# Patient Record
Sex: Female | Born: 1960 | Race: White | Hispanic: No | Marital: Married | Smoking: Never smoker
Health system: Southern US, Community
[De-identification: ages and names within clinical notes are randomized; demographics above are authoritative.]

## PROBLEM LIST (undated history)

## (undated) DIAGNOSIS — I10 Essential (primary) hypertension: Secondary | ICD-10-CM

## (undated) DIAGNOSIS — F419 Anxiety disorder, unspecified: Secondary | ICD-10-CM

## (undated) HISTORY — DX: Anxiety disorder, unspecified: F41.9

## (undated) HISTORY — DX: Essential (primary) hypertension: I10

---

## 1997-09-29 ENCOUNTER — Other Ambulatory Visit: Admission: RE | Admit: 1997-09-29 | Discharge: 1997-09-29 | Payer: Self-pay | Admitting: Obstetrics and Gynecology

## 1998-08-31 ENCOUNTER — Inpatient Hospital Stay (HOSPITAL_COMMUNITY): Admission: AD | Admit: 1998-08-31 | Discharge: 1998-09-03 | Payer: Self-pay | Admitting: Obstetrics and Gynecology

## 1998-10-12 ENCOUNTER — Other Ambulatory Visit: Admission: RE | Admit: 1998-10-12 | Discharge: 1998-10-12 | Payer: Self-pay | Admitting: Obstetrics and Gynecology

## 2000-01-23 ENCOUNTER — Other Ambulatory Visit: Admission: RE | Admit: 2000-01-23 | Discharge: 2000-01-23 | Payer: Self-pay | Admitting: Obstetrics and Gynecology

## 2001-06-17 ENCOUNTER — Other Ambulatory Visit: Admission: RE | Admit: 2001-06-17 | Discharge: 2001-06-17 | Payer: Self-pay | Admitting: Obstetrics and Gynecology

## 2002-09-18 ENCOUNTER — Encounter: Payer: Self-pay | Admitting: Obstetrics and Gynecology

## 2002-09-18 ENCOUNTER — Encounter: Admission: RE | Admit: 2002-09-18 | Discharge: 2002-09-18 | Payer: Self-pay | Admitting: Obstetrics and Gynecology

## 2002-09-23 ENCOUNTER — Other Ambulatory Visit: Admission: RE | Admit: 2002-09-23 | Discharge: 2002-09-23 | Payer: Self-pay | Admitting: Obstetrics and Gynecology

## 2003-10-01 ENCOUNTER — Other Ambulatory Visit: Admission: RE | Admit: 2003-10-01 | Discharge: 2003-10-01 | Payer: Self-pay | Admitting: Obstetrics and Gynecology

## 2003-10-22 ENCOUNTER — Encounter: Admission: RE | Admit: 2003-10-22 | Discharge: 2003-10-22 | Payer: Self-pay | Admitting: Obstetrics and Gynecology

## 2004-11-24 ENCOUNTER — Other Ambulatory Visit: Admission: RE | Admit: 2004-11-24 | Discharge: 2004-11-24 | Payer: Self-pay | Admitting: Obstetrics and Gynecology

## 2004-12-29 ENCOUNTER — Encounter: Admission: RE | Admit: 2004-12-29 | Discharge: 2004-12-29 | Payer: Self-pay | Admitting: Obstetrics and Gynecology

## 2005-11-30 ENCOUNTER — Other Ambulatory Visit: Admission: RE | Admit: 2005-11-30 | Discharge: 2005-11-30 | Payer: Self-pay | Admitting: Obstetrics and Gynecology

## 2006-01-02 ENCOUNTER — Encounter: Admission: RE | Admit: 2006-01-02 | Discharge: 2006-01-02 | Payer: Self-pay | Admitting: Obstetrics and Gynecology

## 2007-01-08 ENCOUNTER — Encounter: Admission: RE | Admit: 2007-01-08 | Discharge: 2007-01-08 | Payer: Self-pay | Admitting: Obstetrics and Gynecology

## 2007-06-20 ENCOUNTER — Other Ambulatory Visit: Admission: RE | Admit: 2007-06-20 | Discharge: 2007-06-20 | Payer: Self-pay | Admitting: Obstetrics and Gynecology

## 2008-01-21 ENCOUNTER — Encounter: Admission: RE | Admit: 2008-01-21 | Discharge: 2008-01-21 | Payer: Self-pay | Admitting: Obstetrics and Gynecology

## 2009-03-04 ENCOUNTER — Encounter: Admission: RE | Admit: 2009-03-04 | Discharge: 2009-03-04 | Payer: Self-pay | Admitting: Obstetrics and Gynecology

## 2009-09-30 LAB — HM PAP SMEAR

## 2010-03-01 ENCOUNTER — Other Ambulatory Visit: Payer: Self-pay | Admitting: Obstetrics and Gynecology

## 2010-03-01 DIAGNOSIS — Z1239 Encounter for other screening for malignant neoplasm of breast: Secondary | ICD-10-CM

## 2010-03-08 ENCOUNTER — Ambulatory Visit
Admission: RE | Admit: 2010-03-08 | Discharge: 2010-03-08 | Disposition: A | Discharge status: Home / Self Care | Payer: PRIVATE HEALTH INSURANCE | Source: Ambulatory Visit | Attending: Obstetrics and Gynecology | Admitting: Obstetrics and Gynecology

## 2010-03-08 DIAGNOSIS — Z1239 Encounter for other screening for malignant neoplasm of breast: Secondary | ICD-10-CM

## 2010-03-10 ENCOUNTER — Ambulatory Visit: Payer: Self-pay

## 2010-03-11 ENCOUNTER — Encounter: Payer: Self-pay | Admitting: Obstetrics and Gynecology

## 2011-03-21 ENCOUNTER — Other Ambulatory Visit: Payer: Self-pay | Admitting: Family Medicine

## 2011-03-21 DIAGNOSIS — Z1231 Encounter for screening mammogram for malignant neoplasm of breast: Secondary | ICD-10-CM

## 2011-04-06 ENCOUNTER — Ambulatory Visit: Payer: PRIVATE HEALTH INSURANCE

## 2011-04-06 LAB — HM COLONOSCOPY

## 2011-04-13 ENCOUNTER — Ambulatory Visit
Admission: RE | Admit: 2011-04-13 | Discharge: 2011-04-13 | Disposition: A | Discharge status: Home / Self Care | Payer: PRIVATE HEALTH INSURANCE | Source: Ambulatory Visit | Attending: Family Medicine | Admitting: Family Medicine

## 2011-04-13 DIAGNOSIS — Z1231 Encounter for screening mammogram for malignant neoplasm of breast: Secondary | ICD-10-CM

## 2012-03-12 ENCOUNTER — Other Ambulatory Visit: Payer: Self-pay | Admitting: Obstetrics and Gynecology

## 2012-03-12 DIAGNOSIS — Z1231 Encounter for screening mammogram for malignant neoplasm of breast: Secondary | ICD-10-CM

## 2012-05-01 ENCOUNTER — Other Ambulatory Visit: Payer: Self-pay | Admitting: Obstetrics and Gynecology

## 2012-05-01 ENCOUNTER — Ambulatory Visit
Admission: RE | Admit: 2012-05-01 | Discharge: 2012-05-01 | Disposition: A | Discharge status: Home / Self Care | Payer: PRIVATE HEALTH INSURANCE | Source: Ambulatory Visit | Attending: Obstetrics and Gynecology | Admitting: Obstetrics and Gynecology

## 2012-05-01 DIAGNOSIS — Z1231 Encounter for screening mammogram for malignant neoplasm of breast: Secondary | ICD-10-CM

## 2012-05-01 LAB — HM MAMMOGRAPHY

## 2012-05-02 ENCOUNTER — Other Ambulatory Visit: Payer: Self-pay | Admitting: Obstetrics and Gynecology

## 2012-05-02 ENCOUNTER — Ambulatory Visit: Payer: Self-pay | Admitting: Obstetrics and Gynecology

## 2012-05-02 ENCOUNTER — Encounter: Payer: Self-pay | Admitting: Obstetrics and Gynecology

## 2012-05-02 ENCOUNTER — Ambulatory Visit (INDEPENDENT_AMBULATORY_CARE_PROVIDER_SITE_OTHER): Payer: PRIVATE HEALTH INSURANCE | Admitting: Obstetrics and Gynecology

## 2012-05-02 VITALS — BP 110/64 | Ht 67.0 in | Wt 210.0 lb

## 2012-05-02 DIAGNOSIS — Z0000 Encounter for general adult medical examination without abnormal findings: Secondary | ICD-10-CM

## 2012-05-02 DIAGNOSIS — Z800 Family history of malignant neoplasm of digestive organs: Secondary | ICD-10-CM

## 2012-05-02 DIAGNOSIS — Z01419 Encounter for gynecological examination (general) (routine) without abnormal findings: Secondary | ICD-10-CM

## 2012-05-02 LAB — POCT URINALYSIS DIPSTICK
Bilirubin, UA: NEGATIVE
Blood, UA: NEGATIVE
Glucose, UA: NEGATIVE
Ketones, UA: NEGATIVE
Leukocytes, UA: NEGATIVE
Nitrite, UA: NEGATIVE
Protein, UA: NEGATIVE
Urobilinogen, UA: NEGATIVE
pH, UA: 7

## 2012-05-02 NOTE — Progress Notes (Signed)
Patient ID: Michele Greer, female   DOB: 1960/08/06, 52 y.o.   MRN: [id number]  52 y.o. MarriedCaucasian female   Michele Greer here for annual exam.   No menses since August 2013.  Occasional hot flashes.   Patient's last menstrual period was 09/20/2011.          Sexually active: yes  The current method of family planning is post menopausal status and vasectomy. Exercising: walking 40 minutes 3 - 4 times a week. Last mammogram:  Yesterday - [facility]. Results pending. Last pap smear:  2011.  No HR HPV done. History of abnormal pap:  Never. Smoking: No. Alcohol:  Occasional wine. Last colonoscopy:  March, 2013 -Benign polyps, Dr Michele Greer.  Patient's sister just diagosed with colon cancer.  Next study due in 5 years. Last Bone Density:  Several years ago.  Normal. Last tetanus shot:  Uncertain.  Believes she had with Dr. Kevan Greer. Last cholesterol check:  Labs with Dr. Kevan Greer.  Urine:  All negative    Health Maintenance  Topic Date Due  . Influenza Vaccine  10/07/1959  . Pap Smear  04/02/1977  . Tetanus/tdap  04/02/1978  . Colonoscopy  04/02/2009  . Mammogram  04/12/2013    Family History  Problem Relation Age of Onset  . Hypertension Father   . Bone cancer Father     deceased  . Osteoarthritis Mother   . Hypertension Mother   . Thyroid disease Mother   . Cancer - Colon Sister     twin sister    There is no problem list on file for this patient.   Past Medical History  Diagnosis Date  . Anxiety   . Hypertension     History reviewed. No pertinent past surgical history.  Allergies: Review of patient's allergies indicates no known allergies.  Current Outpatient Prescriptions  Medication Sig Dispense Refill  . APAP-Isometheptene-Dichloral (MIDRIN PO) Take 1 tablet by mouth as needed.      Marland Kitchen CALCIUM PO Take 1 capsule by mouth daily.      . citalopram (CELEXA) 20 MG tablet Take 20 mg by mouth daily.      . fish oil-omega-3 fatty acids 1000 MG capsule Take 2 g by mouth  daily.      . Ibuprofen 200 MG CAPS Take 2 capsules by mouth as needed.      Marland Kitchen lisinopril-hydrochlorothiazide (PRINZIDE,ZESTORETIC) 20-12.5 MG per tablet Take 1 tablet by mouth daily.      . Multiple Vitamin (MULTIVITAMIN) tablet Take 1 tablet by mouth daily.      Marland Kitchen POTASSIUM PO Take 1 tablet by mouth daily.       No current facility-administered medications for this visit.    ROS: Pertinent items are noted in HPI.  Exam:    BP 110/64  Ht 5\' 7"  (1.702 m)  Wt 210 lb (95.255 kg)  BMI 32.88 kg/m2  LMP 09/20/2011   Wt Readings from Last 3 Encounters:  05/02/12 210 lb (95.255 kg)     Ht Readings from Last 3 Encounters:  05/02/12 5\' 7"  (1.702 m)    General appearance: alert, cooperative and appears stated age Head: Normocephalic, without obvious abnormality, atraumatic Neck: no adenopathy, supple, symmetrical, trachea midline and thyroid not enlarged, symmetric, no tenderness/mass/nodules Lungs: clear to auscultation bilaterally Breasts: Inspection negative, No nipple retraction or dimpling, No nipple discharge or bleeding, No axillary or supraclavicular adenopathy, Normal to palpation without dominant masses Heart: regular rate and rhythm Abdomen: soft, non-tender; bowel sounds normal; no masses,  no organomegaly Extremities: extremities normal, atraumatic, no cyanosis or edema Skin: Skin color, texture, turgor normal. No rashes or lesions Lymph nodes: Cervical, supraclavicular, and axillary nodes normal. No abnormal inguinal nodes palpated Neurologic: Grossly normal   Pelvic: External genitalia:  no lesions              Urethra:  normal appearing urethra with no masses, tenderness or lesions              Bartholins and Skenes: normal                 Vagina: normal appearing vagina with normal color and discharge, no lesions              Cervix: normal appearance              Pap taken: yes and HR HPV requested.        Bimanual Exam:  Uterus:  uterus is normal size, shape,  consistency and nontender                                      Adnexa: normal adnexa in size, nontender and no masses                                      Rectovaginal: Confirms                                      Anus:  normal sphincter tone, no lesions  A: well woman Perimenoapusal female.     P: mammogram pap smear and HR HVP testing return annually or prn     An After Visit Summary was printed and given to the patient.

## 2012-05-02 NOTE — Patient Instructions (Signed)

## 2012-05-07 LAB — IPS PAP TEST WITH HPV

## 2012-06-23 LAB — IFOBT (OCCULT BLOOD): IFOBT: NEGATIVE

## 2012-06-23 NOTE — Addendum Note (Signed)
Addended by: Elisha Headland on: 06/23/2012 11:31 AM   Modules accepted: Orders

## 2012-06-24 ENCOUNTER — Other Ambulatory Visit: Payer: Self-pay | Admitting: Obstetrics and Gynecology

## 2012-06-24 NOTE — Progress Notes (Signed)
Fecal occult blood test performed on specimen received - results negative.

## 2013-04-03 ENCOUNTER — Other Ambulatory Visit: Payer: Self-pay

## 2013-04-03 DIAGNOSIS — Z1231 Encounter for screening mammogram for malignant neoplasm of breast: Secondary | ICD-10-CM

## 2013-05-05 ENCOUNTER — Ambulatory Visit: Payer: PRIVATE HEALTH INSURANCE

## 2013-05-15 ENCOUNTER — Ambulatory Visit
Admission: RE | Admit: 2013-05-15 | Discharge: 2013-05-15 | Disposition: A | Discharge status: Home / Self Care | Payer: 59 | Source: Ambulatory Visit

## 2013-05-15 DIAGNOSIS — Z1231 Encounter for screening mammogram for malignant neoplasm of breast: Secondary | ICD-10-CM

## 2013-12-07 ENCOUNTER — Encounter: Payer: Self-pay | Admitting: Obstetrics and Gynecology

## 2013-12-25 ENCOUNTER — Ambulatory Visit (INDEPENDENT_AMBULATORY_CARE_PROVIDER_SITE_OTHER): Payer: 59 | Admitting: Nurse Practitioner

## 2013-12-25 ENCOUNTER — Encounter: Payer: Self-pay | Admitting: Nurse Practitioner

## 2013-12-25 VITALS — BP 120/74 | HR 64 | Ht 66.25 in | Wt 211.0 lb

## 2013-12-25 DIAGNOSIS — Z01419 Encounter for gynecological examination (general) (routine) without abnormal findings: Secondary | ICD-10-CM

## 2013-12-25 DIAGNOSIS — Z0000 Encounter for general adult medical examination without abnormal findings: Secondary | ICD-10-CM

## 2013-12-25 DIAGNOSIS — Z1211 Encounter for screening for malignant neoplasm of colon: Secondary | ICD-10-CM

## 2013-12-25 LAB — POCT URINALYSIS DIPSTICK
BILIRUBIN UA: NEGATIVE
Glucose, UA: NEGATIVE
KETONES UA: NEGATIVE
LEUKOCYTES UA: NEGATIVE
Nitrite, UA: NEGATIVE
PH UA: 5
Protein, UA: NEGATIVE
RBC UA: NEGATIVE
Urobilinogen, UA: NEGATIVE

## 2013-12-25 NOTE — Patient Instructions (Signed)

## 2013-12-25 NOTE — Progress Notes (Signed)
Patient ID: Michele Greer, female   DOB: Dec 04, 1960, 53 y.o.   MRN: [id number] 53 y.o. G3P3 Married Caucasian Fe here for annual exam.  Some vaso symptoms that are tolerable.  Sleep is OK.   No other new problems this past year.  Anxiety is better on Celexa - now at 1/2 dose daily.  Patient's last menstrual period was 09/20/2011.          Sexually active: Yes.    The current method of family planning is vasectomy and post menopausal status.    Exercising: Yes.    walking 40 minutes 3-4 times per week Smoker:  no  Health Maintenance: Pap:  05/02/12, negative with neg HR HPV  MMG:  05/15/13, Bi-Rads 1:  Negative  Colonoscopy:  March, 2013 -Benign polyps, Dr Watt Climes. Patient's sister diagnosed with colon cancer. Next study due in 5 years. BMD:   2002 -  normal TDaP:  UTD with work Labs:  Dr. Inda Merlin  Urine:  negative   reports that she has never smoked. She does not have any smokeless tobacco history on file. She reports that she drinks about 0.6 oz of alcohol per week. She reports that she does not use illicit drugs.  Past Medical History  Diagnosis Date  . Anxiety   . Hypertension     History reviewed. No pertinent past surgical history.  Current Outpatient Prescriptions  Medication Sig Dispense Refill  . CALCIUM PO Take 1 capsule by mouth daily.    . citalopram (CELEXA) 20 MG tablet Take 20 mg by mouth daily.    . fish oil-omega-3 fatty acids 1000 MG capsule Take 2 g by mouth daily.    . Ibuprofen 200 MG CAPS Take 2 capsules by mouth as needed.    Marland Kitchen lisinopril-hydrochlorothiazide (PRINZIDE,ZESTORETIC) 20-12.5 MG per tablet Take 1 tablet by mouth daily.    . Multiple Vitamin (MULTIVITAMIN) tablet Take 1 tablet by mouth daily.    Marland Kitchen POTASSIUM PO Take 1 tablet by mouth daily.     No current facility-administered medications for this visit.    Family History  Problem Relation Age of Onset  . Hypertension Father   . Bone cancer Father     deceased  . Osteoarthritis Mother   .  Hypertension Mother   . Thyroid disease Mother   . Cancer - Colon Sister     twin sister    ROS:  Pertinent items are noted in HPI.  Otherwise, a comprehensive ROS was negative.  Exam:   BP 120/74 mmHg  Pulse 64  Ht 5' 6.25" (1.683 m)  Wt 211 lb (95.709 kg)  BMI 33.79 kg/m2  LMP 09/20/2011 Height: 5' 6.25" (168.3 cm)  Ht Readings from Last 3 Encounters:  12/25/13 5' 6.25" (1.683 m)  05/02/12 5\' 7"  (1.702 m)    General appearance: alert, cooperative and appears stated age Head: Normocephalic, without obvious abnormality, atraumatic Neck: no adenopathy, supple, symmetrical, trachea midline and thyroid normal to inspection and palpation Lungs: clear to auscultation bilaterally Breasts: normal appearance, no masses or tenderness Heart: regular rate and rhythm Abdomen: soft, non-tender; no masses,  no organomegaly Extremities: extremities normal, atraumatic, no cyanosis or edema Skin: Skin color, texture, turgor normal. No rashes or lesions Lymph nodes: Cervical, supraclavicular, and axillary nodes normal. No abnormal inguinal nodes palpated Neurologic: Grossly normal   Pelvic: External genitalia:  no lesions              Urethra:  normal appearing urethra with no masses, tenderness or lesions  Bartholin's and Skene's: normal                 Vagina: normal appearing vagina with normal color and discharge, no lesions              Cervix: anteverted              Pap taken: No. Bimanual Exam:  Uterus:  normal size, contour, position, consistency, mobility, non-tender              Adnexa: no mass, fullness, tenderness               Rectovaginal: Confirms               Anus:  normal sphincter tone, no lesions  A:  Well Woman with normal exam  Postmenopausal - no HRT  History of anxiety disorder  [place]: colon cancer twin sister stage III   P:   Reviewed health and wellness pertinent to exam  Pap smear taken today  Mammogram is due 4/16  IFOB is given  Counseled  on breast self exam, mammography screening, adequate intake of calcium and vitamin D, diet and exercise, Kegel's exercises return annually or prn  An After Visit Summary was printed and given to the patient.

## 2013-12-27 NOTE — Progress Notes (Signed)
Encounter reviewed by Dr. Arilyn Brierley Silva.  

## 2014-01-07 LAB — FECAL OCCULT BLOOD, IMMUNOCHEMICAL: IFOBT: NEGATIVE

## 2014-01-07 NOTE — Addendum Note (Signed)
Addended by: Graylon Good on: 01/07/2014 04:14 PM   Modules accepted: Orders

## 2014-04-13 ENCOUNTER — Other Ambulatory Visit: Payer: Self-pay

## 2014-04-13 DIAGNOSIS — Z1231 Encounter for screening mammogram for malignant neoplasm of breast: Secondary | ICD-10-CM

## 2014-05-28 ENCOUNTER — Ambulatory Visit
Admission: RE | Admit: 2014-05-28 | Discharge: 2014-05-28 | Disposition: A | Discharge status: Home / Self Care | Payer: 59 | Source: Ambulatory Visit

## 2014-05-28 ENCOUNTER — Ambulatory Visit: Payer: Self-pay

## 2014-05-28 DIAGNOSIS — Z1231 Encounter for screening mammogram for malignant neoplasm of breast: Secondary | ICD-10-CM

## 2015-01-06 ENCOUNTER — Telehealth: Payer: Self-pay | Admitting: Nurse Practitioner

## 2015-01-06 NOTE — Telephone Encounter (Signed)
Left message regarding upcoming appointment has been canceled and needs to be rescheduled. Spoke with patient, she will call later to rs nr

## 2015-01-07 ENCOUNTER — Ambulatory Visit: Payer: Self-pay | Admitting: Nurse Practitioner

## 2015-02-11 ENCOUNTER — Ambulatory Visit (INDEPENDENT_AMBULATORY_CARE_PROVIDER_SITE_OTHER): Payer: 59 | Admitting: Nurse Practitioner

## 2015-02-11 ENCOUNTER — Encounter: Payer: Self-pay | Admitting: Nurse Practitioner

## 2015-02-11 VITALS — BP 120/82 | HR 68 | Ht 66.0 in | Wt 216.0 lb

## 2015-02-11 DIAGNOSIS — Z0000 Encounter for general adult medical examination without abnormal findings: Secondary | ICD-10-CM

## 2015-02-11 DIAGNOSIS — Z01419 Encounter for gynecological examination (general) (routine) without abnormal findings: Secondary | ICD-10-CM

## 2015-02-11 DIAGNOSIS — Z1211 Encounter for screening for malignant neoplasm of colon: Secondary | ICD-10-CM

## 2015-02-11 LAB — POCT URINALYSIS DIPSTICK
Bilirubin, UA: NEGATIVE
Blood, UA: NEGATIVE
Glucose, UA: NEGATIVE
Ketones, UA: NEGATIVE
Leukocytes, UA: NEGATIVE
Nitrite, UA: NEGATIVE
Protein, UA: NEGATIVE
Urobilinogen, UA: NEGATIVE
pH, UA: 6

## 2015-02-11 NOTE — Patient Instructions (Addendum)

## 2015-02-11 NOTE — Progress Notes (Signed)
Patient ID: Michele Greer, female   DOB: 17-Feb-1960, 55 y.o.   MRN: [id number]  56 y.o. G38P0003 Married  Caucasian Fe here for annual exam.  Some vaso symptoms that are tolerable. No sleep issues. Feels well.  Mother now age 91 and just returned from trip to [place] with the oldest brother.  Her twin sister with the colon cancer is in remission and no evidence of recurrence.  Patient's last menstrual period was 09/20/2011 (exact date).          Sexually active: Yes.    The current method of family planning is vasectomy.    Exercising: Yes.    walking Smoker:  no  Health Maintenance: Pap: 05/02/12, Negative with neg HR HPV  MMG:05/28/14, Bi-Rads 1: Negative  Colonoscopy: March, 2013 -Benign polyps, Dr Watt Climes. Patient's sister diagnosed with colon cancer. Next study due in 5 years. BMD: 2002 - normal TDaP: UTD with work Shingles: Not indicated due to age Pneumonia: Not indicated due to age 26 C and HIV: to discuss Labs: Dr. Inda Merlin  Urine: Negative    reports that she has never smoked. She does not have any smokeless tobacco history on file. She reports that she drinks about 0.6 oz of alcohol per week. She reports that she does not use illicit drugs.  Past Medical History  Diagnosis Date  . Anxiety   . Hypertension     History reviewed. No pertinent past surgical history.  Current Outpatient Prescriptions  Medication Sig Dispense Refill  . CALCIUM PO Take 1 capsule by mouth daily.    . citalopram (CELEXA) 20 MG tablet Take 20 mg by mouth daily.    . Ibuprofen 200 MG CAPS Take 2 capsules by mouth as needed.    Marland Kitchen lisinopril-hydrochlorothiazide (PRINZIDE,ZESTORETIC) 20-12.5 MG per tablet Take 1 tablet by mouth daily.    . Multiple Vitamin (MULTIVITAMIN) tablet Take 1 tablet by mouth daily.     No current facility-administered medications for this visit.    Family History  Problem Relation Age of Onset  . Hypertension Father   . Bone cancer Father     deceased  .  Osteoarthritis Mother   . Hypertension Mother   . Thyroid disease Mother   . Cancer - Colon Sister     twin sister    ROS:  Pertinent items are noted in HPI.  Otherwise, a comprehensive ROS was negative.  Exam:   BP 120/82 mmHg  Pulse 68  Ht 5\' 6"  (1.676 m)  Wt 216 lb (97.977 kg)  BMI 34.88 kg/m2  LMP 09/20/2011 (Exact Date) Height: 5\' 6"  (167.6 cm) Ht Readings from Last 3 Encounters:  02/11/15 5\' 6"  (1.676 m)  12/25/13 5' 6.25" (1.683 m)  05/02/12 5\' 7"  (1.702 m)    General appearance: alert, cooperative and appears stated age Head: Normocephalic, without obvious abnormality, atraumatic Neck: no adenopathy, supple, symmetrical, trachea midline and thyroid normal to inspection and palpation Lungs: clear to auscultation bilaterally Breasts: normal appearance, no masses or tenderness Heart: regular rate and rhythm Abdomen: soft, non-tender; no masses,  no organomegaly Extremities: extremities normal, atraumatic, no cyanosis or edema Skin: Skin color, texture, turgor normal. No rashes or lesions Lymph nodes: Cervical, supraclavicular, and axillary nodes normal. No abnormal inguinal nodes palpated Neurologic: Grossly normal   Pelvic: External genitalia:  no lesions              Urethra:  normal appearing urethra with no masses, tenderness or lesions  Bartholin's and Skene's: normal                 Vagina: normal appearing vagina with normal color and discharge, no lesions              Cervix: anteverted              Pap taken: No. Bimanual Exam:  Uterus:  normal size, contour, position, consistency, mobility, non-tender              Adnexa: no mass, fullness, tenderness               Rectovaginal: Confirms               Anus:  normal sphincter tone, no lesions  Chaperone present: no  A:  Well Woman with normal exam  Postmenopausal - no HRT History of anxiety disorder [facility]: colon cancer twin sister stage III - has info on [place]  Syndrome   P:   Reviewed health and wellness pertinent to exam  Pap smear as above  Mammogram is due 05/2015  Will follow with labs and IFOB  Counseled on breast self exam, mammography screening, adequate intake of calcium and vitamin D, diet and exercise return annually or prn  An After Visit Summary was printed and given to the patient.

## 2015-02-12 LAB — HIV ANTIBODY (ROUTINE TESTING W REFLEX): HIV 1&2 Ab, 4th Generation: NONREACTIVE

## 2015-02-12 LAB — HEPATITIS C ANTIBODY: HCV AB: NEGATIVE

## 2015-02-12 NOTE — Progress Notes (Signed)
Encounter reviewed by Dr. Brook Amundson C. Silva.  

## 2015-02-23 ENCOUNTER — Telehealth: Payer: Self-pay | Admitting: Nurse Practitioner

## 2015-02-23 NOTE — Telephone Encounter (Signed)
Update to [place], [place].  Will close encounter.  Routing to provider for final review. Patient agreeable to disposition. Will close encounter.

## 2015-02-23 NOTE — Telephone Encounter (Signed)
Patient's last TDAP was done in September of 2008. She is going for an appointment with Dr. Darcus Austin 06/01/15 and will have her TDAP done then. Patient wanted Ms. Patty to know.

## 2015-03-07 LAB — FECAL OCCULT BLOOD, IMMUNOCHEMICAL: IMMUNOLOGICAL FECAL OCCULT BLOOD TEST: NEGATIVE

## 2015-03-07 NOTE — Addendum Note (Signed)
Addended by: Elroy Channel on: 03/07/2015 04:05 PM   Modules accepted: Orders

## 2015-05-30 ENCOUNTER — Other Ambulatory Visit: Payer: Self-pay

## 2015-05-30 DIAGNOSIS — Z1231 Encounter for screening mammogram for malignant neoplasm of breast: Secondary | ICD-10-CM

## 2015-06-15 ENCOUNTER — Ambulatory Visit: Payer: 59

## 2015-06-17 ENCOUNTER — Ambulatory Visit: Payer: 59

## 2015-06-29 ENCOUNTER — Ambulatory Visit
Admission: RE | Admit: 2015-06-29 | Discharge: 2015-06-29 | Disposition: A | Discharge status: Home / Self Care | Payer: 59 | Source: Ambulatory Visit

## 2015-06-29 DIAGNOSIS — Z1231 Encounter for screening mammogram for malignant neoplasm of breast: Secondary | ICD-10-CM

## 2016-06-06 DIAGNOSIS — R7301 Impaired fasting glucose: Secondary | ICD-10-CM | POA: Diagnosis not present

## 2016-06-06 DIAGNOSIS — I10 Essential (primary) hypertension: Secondary | ICD-10-CM | POA: Diagnosis not present

## 2016-06-07 ENCOUNTER — Other Ambulatory Visit: Payer: Self-pay | Admitting: Nurse Practitioner

## 2016-06-07 DIAGNOSIS — Z1231 Encounter for screening mammogram for malignant neoplasm of breast: Secondary | ICD-10-CM

## 2016-06-15 DIAGNOSIS — D126 Benign neoplasm of colon, unspecified: Secondary | ICD-10-CM | POA: Diagnosis not present

## 2016-06-15 DIAGNOSIS — Z86010 Personal history of colonic polyps: Secondary | ICD-10-CM | POA: Diagnosis not present

## 2016-06-15 DIAGNOSIS — K5730 Diverticulosis of large intestine without perforation or abscess without bleeding: Secondary | ICD-10-CM | POA: Diagnosis not present

## 2016-07-06 ENCOUNTER — Ambulatory Visit: Payer: 59

## 2016-07-31 ENCOUNTER — Ambulatory Visit
Admission: RE | Admit: 2016-07-31 | Discharge: 2016-07-31 | Disposition: A | Discharge status: Home / Self Care | Payer: 59 | Source: Ambulatory Visit | Attending: Nurse Practitioner | Admitting: Nurse Practitioner

## 2016-07-31 DIAGNOSIS — Z1231 Encounter for screening mammogram for malignant neoplasm of breast: Secondary | ICD-10-CM | POA: Diagnosis not present

## 2016-08-22 DIAGNOSIS — L918 Other hypertrophic disorders of the skin: Secondary | ICD-10-CM | POA: Diagnosis not present

## 2016-11-07 DIAGNOSIS — Z23 Encounter for immunization: Secondary | ICD-10-CM | POA: Diagnosis not present

## 2016-12-10 DIAGNOSIS — I10 Essential (primary) hypertension: Secondary | ICD-10-CM | POA: Diagnosis not present

## 2017-01-07 DIAGNOSIS — H16223 Keratoconjunctivitis sicca, not specified as Sjogren's, bilateral: Secondary | ICD-10-CM | POA: Diagnosis not present

## 2017-06-10 DIAGNOSIS — E669 Obesity, unspecified: Secondary | ICD-10-CM | POA: Diagnosis not present

## 2017-06-10 DIAGNOSIS — I10 Essential (primary) hypertension: Secondary | ICD-10-CM | POA: Diagnosis not present

## 2017-07-15 ENCOUNTER — Other Ambulatory Visit: Payer: Self-pay | Admitting: Family Medicine

## 2017-07-15 DIAGNOSIS — Z1231 Encounter for screening mammogram for malignant neoplasm of breast: Secondary | ICD-10-CM

## 2017-08-16 ENCOUNTER — Ambulatory Visit
Admission: RE | Admit: 2017-08-16 | Discharge: 2017-08-16 | Disposition: A | Discharge status: Home / Self Care | Payer: 59 | Source: Ambulatory Visit | Attending: Family Medicine | Admitting: Family Medicine

## 2017-08-16 DIAGNOSIS — Z1231 Encounter for screening mammogram for malignant neoplasm of breast: Secondary | ICD-10-CM

## 2017-12-09 DIAGNOSIS — I10 Essential (primary) hypertension: Secondary | ICD-10-CM | POA: Diagnosis not present

## 2017-12-09 DIAGNOSIS — E669 Obesity, unspecified: Secondary | ICD-10-CM | POA: Diagnosis not present

## 2017-12-09 DIAGNOSIS — Z23 Encounter for immunization: Secondary | ICD-10-CM | POA: Diagnosis not present

## 2018-01-06 ENCOUNTER — Other Ambulatory Visit: Payer: Self-pay

## 2018-01-06 ENCOUNTER — Encounter: Payer: Self-pay | Admitting: Obstetrics and Gynecology

## 2018-01-06 ENCOUNTER — Ambulatory Visit: Payer: 59 | Admitting: Obstetrics and Gynecology

## 2018-01-06 ENCOUNTER — Other Ambulatory Visit (HOSPITAL_COMMUNITY)
Admission: RE | Admit: 2018-01-06 | Discharge: 2018-01-06 | Disposition: A | Discharge status: Home / Self Care | Payer: 59 | Source: Ambulatory Visit | Attending: Obstetrics and Gynecology | Admitting: Obstetrics and Gynecology

## 2018-01-06 VITALS — BP 118/80 | HR 78 | Resp 14 | Ht 65.5 in | Wt 216.0 lb

## 2018-01-06 DIAGNOSIS — Z01419 Encounter for gynecological examination (general) (routine) without abnormal findings: Secondary | ICD-10-CM

## 2018-01-06 DIAGNOSIS — Z1211 Encounter for screening for malignant neoplasm of colon: Secondary | ICD-10-CM | POA: Diagnosis not present

## 2018-01-06 NOTE — Patient Instructions (Signed)

## 2018-01-06 NOTE — Progress Notes (Signed)
57 y.o. G49P0003 Married Caucasian female here for annual exam.    Labs with PCP.   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  Son has Down's Syndrome.  PCP:   Darcus Austin, MD   Patient's last menstrual period was 09/20/2011 (exact date).           Sexually active: Yes.    The current method of family planning is vasectomy.    Exercising: Yes.    walking Smoker:  no  Health Maintenance: Pap:  05-02-12 negative, HR HP negative  History of abnormal Pap:  no MMG:  08-16-17 density B/BIRADS 1 negative  Colonoscopy:  04-2016- Dr. Watt Climes- polyps per patient- f/u 5 years- FH colon cancer in twin sister.  BMD:   2002  Result  Normal  TDaP: Unsure, but thinks UTD with PCP Gardasil:   n/a HIV: 02-11-15 negative  Hep C: 02-11-15 negative  Screening Labs:  Hb today: PCP, Urine today: not collected Flu vaccine:  Done.    reports that she has never smoked. She has never used smokeless tobacco. She reports that she drinks about 1.0 standard drinks of alcohol per week. She reports that she does not use drugs.  Past Medical History:  Diagnosis Date  . Anxiety   . Hypertension     History reviewed. No pertinent surgical history.  Current Outpatient Medications  Medication Sig Dispense Refill  . CALCIUM PO Take 1 capsule by mouth daily.    . citalopram (CELEXA) 10 MG tablet Take 10 mg by mouth daily.  3  . Ibuprofen 200 MG CAPS Take 2 capsules by mouth as needed.    Marland Kitchen lisinopril-hydrochlorothiazide (PRINZIDE,ZESTORETIC) 20-12.5 MG per tablet Take 1 tablet by mouth daily.    . Multiple Vitamin (MULTIVITAMIN) tablet Take 1 tablet by mouth daily.     No current facility-administered medications for this visit.     Family History  Problem Relation Age of Onset  . Hypertension Father   . Bone cancer Father        deceased  . Osteoarthritis Mother   . Hypertension Mother   . Thyroid disease Mother   . Cancer - Colon Sister 64       twin sister  . Heart attack Brother     Review of Systems   Constitutional: Negative.   HENT: Negative.   Eyes: Negative.   Respiratory: Negative.   Cardiovascular: Negative.   Gastrointestinal: Negative.   Endocrine: Negative.   Genitourinary: Negative.   Musculoskeletal: Negative.   Skin: Negative.   Allergic/Immunologic: Negative.   Neurological: Negative.   Hematological: Negative.   Psychiatric/Behavioral: Negative.     Exam:   BP 118/80 (BP Location: Right Arm, Patient Position: Sitting, Cuff Size: Normal)   Pulse 78   Resp 14   Ht 5' 5.5" (1.664 m)   Wt 216 lb (98 kg)   LMP 09/20/2011 (Exact Date)   BMI 35.40 kg/m     General appearance: alert, cooperative and appears stated age Head: Normocephalic, without obvious abnormality, atraumatic Neck: no adenopathy, supple, symmetrical, trachea midline and thyroid normal to inspection and palpation Lungs: clear to auscultation bilaterally Breasts: normal appearance, no masses or tenderness, No nipple retraction or dimpling, No nipple discharge or bleeding, No axillary or supraclavicular adenopathy Heart: regular rate and rhythm Abdomen: soft, non-tender; no masses, no organomegaly Extremities: extremities normal, atraumatic, no cyanosis or edema Skin: Skin color, texture, turgor normal.  Rash across breasts and medial thighs. Patchy plaques.  Lymph nodes: Cervical, supraclavicular, and axillary nodes  normal. No abnormal inguinal nodes palpated Neurologic: Grossly normal  Pelvic: External genitalia:  no lesions              Urethra:  normal appearing urethra with no masses, tenderness or lesions              Bartholins and Skenes: normal                 Vagina: normal appearing vagina with normal color and discharge, no lesions              Cervix: no lesions              Pap taken: Yes.   Bimanual Exam:  Uterus:  normal size, contour, position, consistency, mobility, non-tender              Adnexa: no mass, fullness, tenderness              Rectal exam: Yes.  .  Confirms.               Anus:  normal sphincter tone, no lesions  Chaperone was present for exam.  Assessment:   Well woman visit with normal exam. FH colon cancer in twin sister.  Skin rash.  I suspect tinea corpora.  Plan: Mammogram screening. Recommended self breast awareness. Pap and HR HPV as above. Guidelines for Calcium, Vitamin D, regular exercise program including cardiovascular and weight bearing exercise. She will follow up with dermatology. IFOB. Follow up annually and prn.   After visit summary provided.

## 2018-01-08 LAB — CYTOLOGY - PAP
Diagnosis: NEGATIVE
HPV: NOT DETECTED

## 2018-01-22 ENCOUNTER — Encounter: Payer: Self-pay | Admitting: Cardiovascular Disease

## 2018-01-22 ENCOUNTER — Ambulatory Visit: Payer: 59 | Admitting: Cardiovascular Disease

## 2018-01-22 VITALS — BP 132/84 | HR 57 | Ht 65.5 in | Wt 216.1 lb

## 2018-01-22 DIAGNOSIS — Z8249 Family history of ischemic heart disease and other diseases of the circulatory system: Secondary | ICD-10-CM | POA: Diagnosis not present

## 2018-01-22 DIAGNOSIS — I10 Essential (primary) hypertension: Secondary | ICD-10-CM

## 2018-01-22 NOTE — Patient Instructions (Addendum)
Medication Instructions:  Your physician recommends that you continue on your current medications as directed. Please refer to the Current Medication list given to you today.  If you need a refill on your cardiac medications before your next appointment, please call your pharmacy.    Lab work: None Ordered    Testing/Procedures: Non-Cardiac CT scanning, (CAT scanning) for Calcium score, is a noninvasive, special x-ray that produces cross-sectional images of the body using x-rays and a computer. CT scans help physicians diagnose and treat medical conditions. For some CT exams, a contrast material is used to enhance visibility in the area of the body being studied. CT scans provide greater clarity and reveal more details than regular x-ray exams.    Follow-Up: At [facility], you and your health needs are our priority.  As part of our continuing mission to provide you with exceptional heart care, we have created designated Provider Care Teams.  These Care Teams include your primary Cardiologist (physician) and Advanced Practice Providers (APPs -  Physician Assistants and Nurse Practitioners) who all work together to provide you with the care you need, when you need it. You will need a follow up appointment in:  3 months. You may see Mertie Moores, MD or one of the following Advanced Practice Providers on your designated Care Team: Richardson Dopp, PA-C [place], [place] . Daune Perch, NP

## 2018-01-22 NOTE — Progress Notes (Signed)
Cardiology Office Note:    Date:  01/22/2018   ID:  Michele Greer, DOB 11-May-1960, MRN [id number]  PCP:  Darcus Austin, MD  Cardiologist:  Denys Labree  Electrophysiologist:  None   Referring MD: Darcus Austin, MD   Chief Complaint  Patient presents with  . Hypertension    Problem List 1. Essential HTN 2  Anxiety   History of Present Illness:    Michele Greer is a 57 y.o. female with a hx of HTN   She is here to discuss cardiac risk factors. Father had 2 MI Mother has  AVR Brother had an MI   No CP,   Walks regularly ,   3 days a week .   No DOE  No syncope Some shortness of breath climbing to top of hanging [place]     Past Medical History:  Diagnosis Date  . Anxiety   . Hypertension     History reviewed. No pertinent surgical history.  Current Medications: Current Meds  Medication Sig  . CALCIUM PO Take 1 capsule by mouth daily.  . citalopram (CELEXA) 10 MG tablet Take 10 mg by mouth daily.  . Ibuprofen 200 MG CAPS Take 2 capsules by mouth as needed.  Marland Kitchen lisinopril-hydrochlorothiazide (PRINZIDE,ZESTORETIC) 20-12.5 MG per tablet Take 1 tablet by mouth daily.  . Multiple Vitamin (MULTIVITAMIN) tablet Take 1 tablet by mouth daily.     Allergies:   Patient has no known allergies.   Social History   Socioeconomic History  . Marital status: Married    Spouse name: Not on file  . Number of children: Not on file  . Years of education: Not on file  . Highest education level: Not on file  Occupational History  . Not on file  Social Needs  . Financial resource strain: Not on file  . Food insecurity:    Worry: Not on file    Inability: Not on file  . Transportation needs:    Medical: Not on file    Non-medical: Not on file  Tobacco Use  . Smoking status: Never Smoker  . Smokeless tobacco: Never Used  Substance and Sexual Activity  . Alcohol use: Yes    Alcohol/week: 1.0 standard drinks    Types: 1 Glasses of wine per week  . Drug use: No  . Sexual  activity: Yes    Birth control/protection: Other-see comments    Comment: vasectomy  Lifestyle  . Physical activity:    Days per week: Not on file    Minutes per session: Not on file  . Stress: Not on file  Relationships  . Social connections:    Talks on phone: Not on file    Gets together: Not on file    Attends religious service: Not on file    Active member of club or organization: Not on file    Attends meetings of clubs or organizations: Not on file    Relationship status: Not on file  Other Topics Concern  . Not on file  Social History Narrative  . Not on file     Family History: The patient's family history includes Bone cancer in her father; Cancer - Colon (age of onset: 24) in her sister; Heart attack in her brother; Hypertension in her father and mother; Osteoarthritis in her mother; Thyroid disease in her mother.  ROS:   Please see the history of present illness.     All other systems reviewed and are negative.  EKGs/Labs/Other Studies Reviewed:    The  following studies were reviewed today:  EKG:     Dec. 18, 2019    Sinus brady at 57.  NS ST abn.   Recent Labs: No results found for requested labs within last 8760 hours.  Recent Lipid Panel No results found for: CHOL, TRIG, HDL, CHOLHDL, VLDL, LDLCALC, LDLDIRECT  Physical Exam:    VS:  BP 132/84   Pulse (!) 57   Ht 5' 5.5" (1.664 m)   Wt 216 lb 1.9 oz (98 kg)   LMP 09/20/2011 (Exact Date)   SpO2 95%   BMI 35.42 kg/m     Wt Readings from Last 3 Encounters:  01/22/18 216 lb 1.9 oz (98 kg)  01/06/18 216 lb (98 kg)  02/11/15 216 lb (98 kg)     GEN:  Well nourished, well developed in no acute distress HEENT: Normal NECK: No JVD; No carotid bruits LYMPHATICS: No lymphadenopathy CARDIAC: RRR, no murmurs, rubs, gallops RESPIRATORY:  Clear to auscultation without rales, wheezing or rhonchi  ABDOMEN: Soft, non-tender, non-distended MUSCULOSKELETAL:  No edema; No deformity  SKIN: Warm and  dry NEUROLOGIC:  Alert and oriented x 3 PSYCHIATRIC:  Normal affect   ASSESSMENT:    1. Family history of early CAD   2. Essential hypertension    PLAN:    In order of problems listed above:  1. Family history of cardiac disease: She has a fairly strong family history of cardiac disease.  Her mother, father, and brother have all had coronary artery disease.  She is not having any particular symptoms.  She did have some shortness of breath while climbing hanging [facility].  Last lipid levels look fairly good.  I would like to do a coronary calcium score. .  This will help [place] determine whether or not she needs start on a statin. Advised her to continue with a good diet, exercise, weight loss program.  2.  Essential hypertension: Blood pressure  Is stable.    Medication Adjustments/Labs and Tests Ordered: Current medicines are reviewed at length with the patient today.  Concerns regarding medicines are outlined above.  Orders Placed This Encounter  Procedures  . CT CARDIAC SCORING  . EKG 12-Lead   No orders of the defined types were placed in this encounter.   Patient Instructions  Medication Instructions:  Your physician recommends that you continue on your current medications as directed. Please refer to the Current Medication list given to you today.  If you need a refill on your cardiac medications before your next appointment, please call your pharmacy.    Lab work: None Ordered    Testing/Procedures: Non-Cardiac CT scanning, (CAT scanning) for Calcium score, is a noninvasive, special x-ray that produces cross-sectional images of the body using x-rays and a computer. CT scans help physicians diagnose and treat medical conditions. For some CT exams, a contrast material is used to enhance visibility in the area of the body being studied. CT scans provide greater clarity and reveal more details than regular x-ray exams.    Follow-Up: At [facility], you and  your health needs are our priority.  As part of our continuing mission to provide you with exceptional heart care, we have created designated Provider Care Teams.  These Care Teams include your primary Cardiologist (physician) and Advanced Practice Providers (APPs -  Physician Assistants and Nurse Practitioners) who all work together to provide you with the care you need, when you need it. You will need a follow up appointment in:  3 months.  You may see Mertie Moores, MD or one of the following Advanced Practice Providers on your designated Care Team: Richardson Dopp, PA-C [place], [place] . Daune Perch, NP      Signed, Mertie Moores, MD  01/22/2018 6:07 PM    [place]

## 2018-02-12 ENCOUNTER — Ambulatory Visit (INDEPENDENT_AMBULATORY_CARE_PROVIDER_SITE_OTHER)
Admission: RE | Admit: 2018-02-12 | Discharge: 2018-02-12 | Disposition: A | Discharge status: Home / Self Care | Payer: Self-pay | Source: Ambulatory Visit | Attending: Cardiovascular Disease | Admitting: Cardiovascular Disease

## 2018-02-12 ENCOUNTER — Encounter (INDEPENDENT_AMBULATORY_CARE_PROVIDER_SITE_OTHER): Payer: Self-pay

## 2018-02-12 DIAGNOSIS — Z8249 Family history of ischemic heart disease and other diseases of the circulatory system: Secondary | ICD-10-CM

## 2018-02-13 ENCOUNTER — Telehealth: Payer: Self-pay | Admitting: Nurse Practitioner

## 2018-02-13 DIAGNOSIS — R931 Abnormal findings on diagnostic imaging of heart and coronary circulation: Secondary | ICD-10-CM

## 2018-02-13 DIAGNOSIS — Z8249 Family history of ischemic heart disease and other diseases of the circulatory system: Secondary | ICD-10-CM

## 2018-02-13 MED ORDER — ROSUVASTATIN CALCIUM 5 MG PO TABS: 5.0000 mg | ORAL_TABLET | Freq: Every day | ORAL | 3 refills | Status: DC

## 2018-02-13 NOTE — Telephone Encounter (Signed)
-----   Message from Thayer Headings, MD sent at 02/12/2018  5:07 PM EST ----- Coronary calcium score is very low.  Coronary calcium score is 5 which indicates very minimal plaque accumulation.  I would recommend that we start rosuvastatin 5 mg a day.  Check fasting lipids, liver enzymes and basic metabolic profile in 3 months.  I would also like to check a NMR lipid panel at that time .

## 2018-02-13 NOTE — Telephone Encounter (Signed)
Results and plan of care reviewed with patient who verbalized understanding and agreement. I scheduled patient for lab appointment on 4/3 and follow-up with Dr. Acie Fredrickson was moved to 4/14 from 3/25. I advised patient to call back with questions or concerns prior to appointment and she thanked me for the call.

## 2018-02-24 ENCOUNTER — Telehealth: Payer: Self-pay | Admitting: Cardiovascular Disease

## 2018-02-24 MED ORDER — ROSUVASTATIN CALCIUM 5 MG PO TABS: 5.0000 mg | ORAL_TABLET | ORAL | 3 refills | Status: DC

## 2018-02-24 NOTE — Telephone Encounter (Signed)
Spoke with patient regarding her concerns about Rosuvastatin. I answered questions to her satisfaction. She agrees to start Rosuvastatin 5 mg 3 times per week. She will keep lab appointment for 4/3 and follow-up appointment with Dr. Acie Fredrickson on 4/14. I advised her to call back with questions or concerns prior to those appointments. She thanked me for my help.

## 2018-02-24 NOTE — Telephone Encounter (Signed)
New Message   Pt c/o medication issue:  1. Name of Medication: Rosuvastatin   2. How are you currently taking this medication (dosage and times per day)? 5mg  1x daily   3. Are you having a reaction (difficulty breathing--STAT)? No  4. What is your medication issue? Pt wants to talk about the possible side effects of the medication

## 2018-04-30 ENCOUNTER — Ambulatory Visit: Payer: 59 | Admitting: Cardiovascular Disease

## 2018-05-06 ENCOUNTER — Telehealth: Payer: Self-pay

## 2018-05-06 ENCOUNTER — Telehealth: Payer: Self-pay | Admitting: Cardiovascular Disease

## 2018-05-06 NOTE — Telephone Encounter (Signed)
Michele Greer has given consent to have televisit 05/08/2018 at 9am with Dr. Acie Fredrickson. Michele Greer does not have capability for BP, or HR at home but will have her weight ready for appt.     YOUR CARDIOLOGY TEAM HAS ARRANGED FOR AN E-VISIT FOR YOUR APPOINTMENT - PLEASE REVIEW IMPORTANT INFORMATION BELOW SEVERAL DAYS PRIOR TO YOUR APPOINTMENT  Due to the recent COVID-19 pandemic, we are transitioning in-person office visits to tele-medicine visits in an effort to decrease unnecessary exposure to our patients and staff. Medicare and most insurances are covering these visits without a copay needed. You will need a working email and a smartphone or computer with a camera and microphone. For patients that do not have these items, we can still complete the visit using a telephone but do prefer video when possible. If possible, we also ask that you have a blood pressure cuff and scale at home to measure your blood pressure, heart rate and weight prior to your scheduled appointment. Patients with clinical needs that need an in-person evaluation and testing will still be able to come to the office if absolutely necessary. If you have any questions, feel free to call our office.     DOWNLOADING THE SOFTWARE  Download the [employer] app to enable video and telephone visits with your  [facility] Provider.   Instructions for downloading [employer] WebEx: - Go to https://www.webex.com/downloads.html and follow the instructions, or download the app on your smartphone [facility] [employer] Meetings). - If you have technical difficulties with downloading WebEx, please call WebEx at [phone]. - Once the app is downloaded (can be done on either mobile or desktop computer), go to Settings in the upper left hand corner.  Be sure that camera and audio are enabled.  - You will receive an email message with a link to the meeting with a time to join for your tele-health visit.  - Please download the app and have settings configured prior to the  appointment time.      2-3 DAYS BEFORE YOUR APPOINTMENT  One of our staff will call you to confirm that you have been able to set up your WebEx account. We will remind you check your blood pressure, heart rate and weight prior to your scheduled appointment. If you have an Apple Watch or Kardia, please upload any pertinent ECG strips the day before or morning of your appointment to [place]. Our staff will also make sure you have reviewed the consent and agree to move forward with your scheduled tele-health visit.    THE DAY OF YOUR APPOINTMENT  Approximately 15-20 minutes prior to your scheduled appointment, you will receive an e-mail directly from one of our staff member's [email or web address] e-mail accounts inviting you to join a WebEx meeting.  Please do not reply to that email - simply join the [employer].  Upon joining, a member of the office staff will speak with you initially through the WebEx platform to confirm medications, vital signs for the day and any symptoms you may be experiencing.  Please have this information available prior to the time of visit start.      CONSENT FOR TELE-HEALTH VISIT - PLEASE RVIEW  I hereby voluntarily request, consent and authorize [facility] and its employed or contracted physicians, physician assistants, nurse practitioners or other licensed health care professionals (the Practitioner), to provide me with telemedicine health care services (the "Services") as deemed necessary by the treating Practitioner. I acknowledge and consent to receive the Services by the Practitioner via telemedicine. I  understand that the telemedicine visit will involve communicating with the Practitioner through live audiovisual communication technology and the disclosure of certain medical information by electronic transmission. I acknowledge that I have been given the opportunity to request an in-person assessment or other available alternative prior to the telemedicine visit  and am voluntarily participating in the telemedicine visit.  I understand that I have the right to withhold or withdraw my consent to the use of telemedicine in the course of my care at any time, without affecting my right to future care or treatment, and that the Practitioner or I may terminate the telemedicine visit at any time. I understand that I have the right to inspect all information obtained and/or recorded in the course of the telemedicine visit and may receive copies of available information for a reasonable fee.  I understand that some of the potential risks of receiving the Services via telemedicine include:  Marland Kitchen Delay or interruption in medical evaluation due to technological equipment failure or disruption; . Information transmitted may not be sufficient (e.g. poor resolution of images) to allow for appropriate medical decision making by the Practitioner; and/or  . In rare instances, security protocols could fail, causing a breach of personal health information.  Furthermore, I acknowledge that it is my responsibility to provide information about my medical history, conditions and care that is complete and accurate to the best of my ability. I acknowledge that Practitioner's advice, recommendations, and/or decision may be based on factors not within their control, such as incomplete or inaccurate data provided by me or distortions of diagnostic images or specimens that may result from electronic transmissions. I understand that the practice of medicine is not an exact science and that Practitioner makes no warranties or guarantees regarding treatment outcomes. I acknowledge that I will receive a copy of this consent concurrently upon execution via email to the email address I last provided but may also request a printed copy by calling the office of [place].    I understand that my insurance will be billed for this visit.   I have read or had this consent read to me. . I understand  the contents of this consent, which adequately explains the benefits and risks of the Services being provided via telemedicine.  . I have been provided ample opportunity to ask questions regarding this consent and the Services and have had my questions answered to my satisfaction. . I give my informed consent for the services to be provided through the use of telemedicine in my medical care  By participating in this telemedicine visit I agree to the above.

## 2018-05-06 NOTE — Telephone Encounter (Signed)
Spoke with patient who states that she would prefer to speak to provider over the phone instead of webex.  Confirmed all demographics, e-mail and My Chart activation.

## 2018-05-08 ENCOUNTER — Other Ambulatory Visit: Payer: Self-pay

## 2018-05-08 ENCOUNTER — Telehealth (INDEPENDENT_AMBULATORY_CARE_PROVIDER_SITE_OTHER): Payer: 59 | Admitting: Cardiovascular Disease

## 2018-05-08 DIAGNOSIS — Z8249 Family history of ischemic heart disease and other diseases of the circulatory system: Secondary | ICD-10-CM | POA: Insufficient documentation

## 2018-05-08 DIAGNOSIS — E782 Mixed hyperlipidemia: Secondary | ICD-10-CM

## 2018-05-08 DIAGNOSIS — E785 Hyperlipidemia, unspecified: Secondary | ICD-10-CM | POA: Insufficient documentation

## 2018-05-08 NOTE — Progress Notes (Signed)
Virtual Visit via Telephone Note    Evaluation Performed:  Follow-up visit  This visit type was conducted due to national recommendations for restrictions regarding the COVID-19 Pandemic (e.g. social distancing).  This format is felt to be most appropriate for this patient at this time.  All issues noted in this document were discussed and addressed.  No physical exam was performed (except for noted visual exam findings with Video Visits).  Please refer to the patient's chart (MyChart message for video visits and phone note for telephone visits) for the patient's consent to telehealth for [facility].  Date:  05/08/2018   ID:  Michele Greer, DOB [birth year], MRN [id number]  Patient Location:  Home   Provider location:    [facility], [place]   PCP:  Patient, No Pcp Per  Cardiologist:  Mertie Moores, MD  Electrophysiologist:  None   Chief Complaint:  Follow up HTN,  family hx of CAD   History of Present Illness:    Michele Greer is a 58 y.o. female who presents via audio/video conferencing for a telehealth visit today.    I saw Michele Greer in Dec. 2019. She has a strong family hx of CAD Coronary calcium score is 5 ( 73% of age / sex matched controls)   No CP , no dyspnea. Has been hiking some  Walking more recently with the Covid 19 concern No fever, cough, headaches No syncope or presycope   We have a Lipomed profile ordered which she will have drawn once the virus concern is passes.  Takes crestor 5 mg 3 times a week.     The patient does not have symptoms concerning for COVID-19 infection (fever, chills, cough, or new shortness of breath).    Prior CV studies:   The following studies were reviewed today:    Past Medical History:  Diagnosis Date  . Anxiety   . Hypertension    No past surgical history on file.   Current Meds  Medication Sig  . citalopram (CELEXA) 10 MG tablet Take 10 mg by mouth daily.  . Ibuprofen 200 MG CAPS Take 2 capsules by mouth  as needed.  Marland Kitchen lisinopril-hydrochlorothiazide (PRINZIDE,ZESTORETIC) 20-12.5 MG per tablet Take 1 tablet by mouth daily.  . Multiple Vitamin (MULTIVITAMIN) tablet Take 1 tablet by mouth daily.  . rosuvastatin (CRESTOR) 5 MG tablet Take 1 tablet (5 mg total) by mouth 3 (three) times a week.  . TURMERIC PO Take 1 tablet by mouth daily.     Allergies:   Patient has no known allergies.   Social History   Tobacco Use  . Smoking status: Never Smoker  . Smokeless tobacco: Never Used  Substance Use Topics  . Alcohol use: Yes    Alcohol/week: 1.0 standard drinks    Types: 1 Glasses of wine per week  . Drug use: No     Family Hx: The patient's family history includes Bone cancer in her father; Cancer - Colon (age of onset: 87) in her sister; Heart attack in her brother; Hypertension in her father and mother; Osteoarthritis in her mother; Thyroid disease in her mother.  ROS:   Please see the history of present illness.    All other systems reviewed and are negative.   Labs/Other Tests and Data Reviewed:    Recent Labs: No results found for requested labs within last 8760 hours.   Recent Lipid Panel No results found for: CHOL, TRIG, HDL, CHOLHDL, LDLCALC, LDLDIRECT  Wt Readings from Last 3 Encounters:  05/08/18  213 lb (96.6 kg)  01/22/18 216 lb 1.9 oz (98 kg)  01/06/18 216 lb (98 kg)     Objective:    Vital Signs:  Ht 5\' 8"  (1.727 m)   Wt 213 lb (96.6 kg)   LMP 09/20/2011 (Exact Date)   BMI 32.39 kg/m      ASSESSMENT & PLAN:    1.   CAD:  Coronary calcium score of 5 placing her in the 73rd % for sex / age matched controls.   I would like her LDL around 50-60 if possible.  Will increase her crestor to 5 mg  5 days a week for the next month to seeif she tolerates that .   2.  Hyperlipidemia: We will increase her Crestor as noted above.  She does know she does not tolerate the statin medication.  We will check a lipid med profile in mid to late May.  Her goal LDL is around  50 ideally.  3. Strong family hx of CAD.  We will try to aggressively treat her risk factors.  I have advised her to work on a better diet, exercise, weight loss program.  Continue Crestor.  COVID-19 Education: The signs and symptoms of COVID-19 were discussed with the patient and how to seek care for testing (follow up with PCP or arrange E-visit).  The importance of social distancing was discussed today.  Patient Risk:   After full review of this patient's clinical status, I feel that they are at least moderate risk at this time.  Time:   Today, I have spent 17 minutes with the patient with telehealth technology discussing cholesterol managemnt, wt loss .     Medication Adjustments/Labs and Tests Ordered: Current medicines are reviewed at length with the patient today.  Concerns regarding medicines are outlined above.  Tests Ordered: No orders of the defined types were placed in this encounter.  Medication Changes: No orders of the defined types were placed in this encounter.   Disposition:  Follow up in 6 month(s)  Signed, Mertie Moores, MD  05/08/2018 9:07 AM    [facility]

## 2018-05-08 NOTE — Patient Instructions (Addendum)
Medication Instructions:  Your physician has recommended you make the following change in your medication:  INCREASE Crestor (Rosuvastatin) to 5 mg 5 days per week then to 7 days per week  If you need a refill on your cardiac medications before your next appointment, please call your pharmacy.    Lab work: Your physician recommends that you return for lab work on Tuesday May 19. You may come in anytime after 7:30 am You will need to FAST for this appointment - nothing to eat or drink after midnight the night before except water.   Testing/Procedures: None Ordered   Follow-Up: At [facility], you and your health needs are our priority.  As part of our continuing mission to provide you with exceptional heart care, we have created designated Provider Care Teams.  These Care Teams include your primary Cardiologist (physician) and Advanced Practice Providers (APPs -  Physician Assistants and Nurse Practitioners) who all work together to provide you with the care you need, when you need it. You will need a follow up appointment in:  6 months.  Please call our office 2 months in advance to schedule this appointment.  You may see Mertie Moores, MD or one of the following Advanced Practice Providers on your designated Care Team: Richardson Dopp, PA-C [place], [place] . Daune Perch, NP       [facility] Low Glycemic Diet (Source: [facility], 2006) Low Glycemic Foods (20-49) (Decrease risk of developing heart disease) Breakfast Cereals: All-Bran All-Bran Fruit 'n Oats Fiber One Oatmeal (not instant) Oat bran Fruits and fruit juices: (Limit to 1-2 servings per day) Apples Apricots (fresh & dried) Blackberries Blueberries Cherries Cranberries Peaches Pears Plums Prunes Grapefruit Raspberries Strawberries Tangerine Apple juice Grapefruit juice Tomato juice Beans and legumes (fresh-cooked): Black-eyed peas Butter beans Chick peas Lentils  Green beans [place]ima  beans Kidney beans Navy beans Pinto beans Snow peas Non-starchy vegetables: Asparagus, avocado, broccoli, cabbage, cauliflower, celery, cucumber, greens, lettuce, mushrooms, peppers, tomatoes, okra, onions, spinach, summer squash Grains: Barley Bulgur Rye Wild rice Nuts and oils : Almonds Peanuts Sunflower seeds Hazelnuts Pecans Walnuts Oils that are liquid at room temperature Dairy, fish, meat, soy, and eggs: Milk, skim Lowfat cheese Yogurt, lowfat, fruit sugar sweetened Lean red meat Fish  Skinless chicken & [place] Shellfish Egg whites (up to 3 daily) Soy products  Egg yolks (up to 7 or _____ per week) Moderate Glycemic Foods (50-69) Breakfast Cereals: Bran Buds Bran Chex Just Right Mini-Wheats  Special K Swiss muesli Fruits: Banana (under-ripe) Dates Figs Grapes Kiwi Mango Oranges Raisins Fruit Juices: Cranberry juice Orange juice Beans and legumes: [place]-type baked beans Canned pinto, kidney, or navy beans Green peas Vegetables: Beets Carrots  Sweet potato Yam Corn on the cob Breads: Pita (pocket) bread Oat bran bread Pumpernickel bread Rye bread Wheat bread, high fiber  Grains: Cornmeal Rice, brown Rice, white Couscous Pasta: Macaroni Pizza, cheese Ravioli, meat filled Spaghetti, white  Nuts: Cashews Macadamia Snacks: Chocolate Ice cream, lowfat Muffin Popcorn High Glycemic Foods (70-100)  Breakfast Cereals: Cheerios Corn Chex Corn Flakes Cream of Wheat Grape Nuts Grape Nut Flakes Grits Nutri-Grain Puffed Rice Puffed Wheat Rice Chex Rice Krispies Shredded Wheat Team Total Fruits: Pineapple Watermelon Banana (over-ripe) Beverages: Sodas, sweet tea, pineapple juice Vegetables: Potato, baked, boiled, fried, mashed [place] fries Canned or frozen corn Parsnips Winter squash Breads: Most breads (white and whole grain) Bagels Bread sticks Bread stuffing Kaiser roll Dinner rolls Grains: Rice, instant Tapioca, with milk Candy and  most cookies  Snacks: Donuts Corn chips Jelly beans Pretzels Pastries               Mediterranean Diet A Mediterranean diet refers to food and lifestyle choices that are based on the traditions of countries located on the [employer]. This way of eating has been shown to help prevent certain conditions and improve outcomes for people who have chronic diseases, like kidney disease and heart disease. What are tips for following this plan? Lifestyle  Cook and eat meals together with your family, when possible.  Drink enough fluid to keep your urine clear or pale yellow.  Be physically active every day. This includes: ? Aerobic exercise like running or swimming. ? Leisure activities like gardening, walking, or housework.  Get 7-8 hours of sleep each night.  If recommended by your health care provider, drink red wine in moderation. This means 1 glass a day for nonpregnant women and 2 glasses a day for men. A glass of wine equals 5 oz (150 mL). Reading food labels   Check the serving size of packaged foods. For foods such as rice and pasta, the serving size refers to the amount of cooked product, not dry.  Check the total fat in packaged foods. Avoid foods that have saturated fat or trans fats.  Check the ingredients list for added sugars, such as corn syrup. Shopping  At the grocery store, buy most of your food from the areas near the walls of the store. This includes: ? Fresh fruits and vegetables (produce). ? Grains, beans, nuts, and seeds. Some of these may be available in unpackaged forms or large amounts (in bulk). ? Fresh seafood. ? Poultry and eggs. ? Low-fat dairy products.  Buy whole ingredients instead of prepackaged foods.  Buy fresh fruits and vegetables in-season from local farmers markets.  Buy frozen fruits and vegetables in resealable bags.  If you do not have access to quality fresh seafood, buy precooked frozen shrimp or canned fish, such as tuna, salmon, or  sardines.  Buy small amounts of raw or cooked vegetables, salads, or olives from the deli or salad bar at your store.  Stock your pantry so you always have certain foods on hand, such as olive oil, canned tuna, canned tomatoes, rice, pasta, and beans. Cooking  Cook foods with extra-virgin olive oil instead of using butter or other vegetable oils.  Have meat as a side dish, and have vegetables or grains as your main dish. This means having meat in small portions or adding small amounts of meat to foods like pasta or stew.  Use beans or vegetables instead of meat in common dishes like chili or lasagna.  Experiment with different cooking methods. Try roasting or broiling vegetables instead of steaming or sauteing them.  Add frozen vegetables to soups, stews, pasta, or rice.  Add nuts or seeds for added healthy fat at each meal. You can add these to yogurt, salads, or vegetable dishes.  Marinate fish or vegetables using olive oil, lemon juice, garlic, and fresh herbs. Meal planning   Plan to eat 1 vegetarian meal one day each week. Try to work up to 2 vegetarian meals, if possible.  Eat seafood 2 or more times a week.  Have healthy snacks readily available, such as: ? Vegetable sticks with hummus. ? [place] yogurt. ? Fruit and nut trail mix.  Eat balanced meals throughout the week. This includes: ? Fruit: 2-3 servings a day ? Vegetables: 4-5 servings a day ? Low-fat  dairy: 2 servings a day ? Fish, poultry, or lean meat: 1 serving a day ? Beans and legumes: 2 or more servings a week ? Nuts and seeds: 1-2 servings a day ? Whole grains: 6-8 servings a day ? Extra-virgin olive oil: 3-4 servings a day  Limit red meat and sweets to only a few servings a month What are my food choices?  Mediterranean diet ? Recommended ? Grains: Whole-grain pasta. Brown rice. Bulgar wheat. Polenta. Couscous. Whole-wheat bread. [place]. ? Vegetables: Artichokes. Beets. Broccoli. Cabbage.  Carrots. Eggplant. Green beans. Chard. Kale. Spinach. Onions. Leeks. Peas. Squash. Tomatoes. Peppers. Radishes. ? Fruits: Apples. Apricots. Avocado. Berries. Bananas. Cherries. Dates. Figs. Grapes. Lemons. Melon. Oranges. Peaches. Plums. Pomegranate. ? Meats and other protein foods: Beans. Almonds. Sunflower seeds. Pine nuts. Peanuts. [place]. Salmon. Scallops. Shrimp. [place]. Tilapia. Clams. Oysters. Eggs. ? Dairy: Low-fat milk. Cheese. Greek yogurt. ? Beverages: Water. Red wine. Herbal tea. ? Fats and oils: Extra virgin olive oil. Avocado oil. Grape seed oil. ? Sweets and desserts: [place] yogurt with honey. Baked apples. Poached pears. Trail mix. ? Seasoning and other foods: Basil. Cilantro. Coriander. Cumin. Mint. Parsley. Sage. Rosemary. Tarragon. Garlic. Oregano. Thyme. Pepper. Balsalmic vinegar. Tahini. Hummus. Tomato sauce. Olives. Mushrooms. ? Limit these ? Grains: Prepackaged pasta or rice dishes. Prepackaged cereal with added sugar. ? Vegetables: Deep fried potatoes (french fries). ? Fruits: Fruit canned in syrup. ? Meats and other protein foods: Beef. Pork. Lamb. Poultry with skin. Hot dogs. Berniece Salines. ? Dairy: Ice cream. Sour cream. Whole milk. ? Beverages: Juice. Sugar-sweetened soft drinks. Beer. Liquor and spirits. ? Fats and oils: Butter. Canola oil. Vegetable oil. Beef fat (tallow). Lard. ? Sweets and desserts: Cookies. Cakes. Pies. Candy. ? Seasoning and other foods: Mayonnaise. Premade sauces and marinades. ? The items listed may not be a complete list. Talk with your dietitian about what dietary choices are right for you. Summary  The Mediterranean diet includes both food and lifestyle choices.  Eat a variety of fresh fruits and vegetables, beans, nuts, seeds, and whole grains.  Limit the amount of red meat and sweets that you eat.  Talk with your health care provider about whether it is safe for you to drink red wine in moderation. This means 1 glass a day for nonpregnant women  and 2 glasses a day for men. A glass of wine equals 5 oz (150 mL). This information is not intended to replace advice given to you by your health care provider. Make sure you discuss any questions you have with your health care provider. Document Released: 09/15/2015 Document Revised: 10/18/2015 Document Reviewed: 09/15/2015 Elsevier Interactive Patient Education  2019 [employer].

## 2018-05-09 ENCOUNTER — Other Ambulatory Visit: Payer: 59

## 2018-05-16 ENCOUNTER — Other Ambulatory Visit: Payer: 59

## 2018-05-20 ENCOUNTER — Ambulatory Visit: Payer: 59 | Admitting: Cardiovascular Disease

## 2018-06-20 ENCOUNTER — Other Ambulatory Visit: Payer: Self-pay

## 2018-06-20 ENCOUNTER — Other Ambulatory Visit: Payer: 59 | Admitting: *Deleted

## 2018-06-20 DIAGNOSIS — R931 Abnormal findings on diagnostic imaging of heart and coronary circulation: Secondary | ICD-10-CM

## 2018-06-20 DIAGNOSIS — Z8249 Family history of ischemic heart disease and other diseases of the circulatory system: Secondary | ICD-10-CM

## 2018-06-21 LAB — BASIC METABOLIC PANEL
BUN/Creatinine Ratio: 24 — ABNORMAL HIGH (ref 9–23)
BUN: 20 mg/dL (ref 6–24)
CO2: 28 mmol/L (ref 20–29)
Calcium: 9.9 mg/dL (ref 8.7–10.2)
Chloride: 100 mmol/L (ref 96–106)
Creatinine, Ser: 0.83 mg/dL (ref 0.57–1.00)
GFR calc Af Amer: 89 mL/min/{1.73_m2} (ref >59)
GFR calc non Af Amer: 77 mL/min/{1.73_m2} (ref >59)
Glucose: 100 mg/dL — ABNORMAL HIGH (ref 65–99)
Potassium: 4 mmol/L (ref 3.5–5.2)
Sodium: 141 mmol/L (ref 134–144)

## 2018-06-21 LAB — NMR LIPOPROF + GRAPH
Cholesterol, Total: 145 mg/dL (ref 100–199)
HDL Particle Number: 41.9 umol/L (ref >=30.5)
HDL-C: 54 mg/dL (ref >39)
LDL Particle Number: 1044 nmol/L — ABNORMAL HIGH (ref <1000)
LDL Size: 20 nm — ABNORMAL LOW (ref >20.5)
LDL-C: 74 mg/dL (ref 0–99)
LP-IR Score: 56 — ABNORMAL HIGH (ref <=45)
Small LDL Particle Number: 701 nmol/L — ABNORMAL HIGH (ref <=527)
Triglycerides: 86 mg/dL (ref 0–149)

## 2018-06-21 LAB — HEPATIC FUNCTION PANEL
ALT: 29 IU/L (ref 0–32)
AST: 27 IU/L (ref 0–40)
Albumin: 4.5 g/dL (ref 3.8–4.9)
Alkaline Phosphatase: 59 IU/L (ref 39–117)
Bilirubin Total: 0.5 mg/dL (ref 0.0–1.2)
Bilirubin, Direct: 0.14 mg/dL (ref 0.00–0.40)
Total Protein: 6.2 g/dL (ref 6.0–8.5)

## 2018-06-24 ENCOUNTER — Other Ambulatory Visit: Payer: 59

## 2018-06-25 ENCOUNTER — Telehealth: Payer: Self-pay | Admitting: Cardiovascular Disease

## 2018-06-25 DIAGNOSIS — I10 Essential (primary) hypertension: Secondary | ICD-10-CM

## 2018-06-25 DIAGNOSIS — R931 Abnormal findings on diagnostic imaging of heart and coronary circulation: Secondary | ICD-10-CM

## 2018-06-25 DIAGNOSIS — E782 Mixed hyperlipidemia: Secondary | ICD-10-CM

## 2018-06-25 DIAGNOSIS — Z79899 Other long term (current) drug therapy: Secondary | ICD-10-CM

## 2018-06-25 MED ORDER — ROSUVASTATIN CALCIUM 5 MG PO TABS: 5.0000 mg | ORAL_TABLET | ORAL | 3 refills | Status: DC

## 2018-06-25 MED ORDER — EZETIMIBE 10 MG PO TABS: 10.0000 mg | ORAL_TABLET | Freq: Every day | ORAL | 3 refills | Status: DC

## 2018-06-25 NOTE — Telephone Encounter (Signed)
Pt verbalized understanding to add Zetia to her med regimen... will return 10/01/18 for repeat fasting labwork. Will call if she has any problems.

## 2018-06-25 NOTE — Telephone Encounter (Signed)
Pt given Dr. Elmarie Shiley recommendations re: her Lipids and to increase her Rosuvastatin to daily.. pt had increased the 5mg  from 3 days to 5 days: M,W,F,Sat since her 05/08/18 visit and she feels she could be at her MAX dosing.. even though she has noticed the increased muscle aches, it is tolerable but thinks it may not be if worsens but willing to try a 5th day if Dr. Acie Fredrickson recommends with this added information.   I advised her to keep doing whet she doing and I will forward to Dr. Dahlia Client for review and advice.

## 2018-06-25 NOTE — Telephone Encounter (Signed)
New Message ° ° °Pt is returning call for lab results  ° ° °Please call back  °

## 2018-06-25 NOTE — Telephone Encounter (Signed)
I dont think adding just 1 additional Rosuvastatin a week will get [place] to goal. Lets add Zetia 10 mg a day  Check labs - lipids. Liver, bmp in 3 months

## 2018-07-30 ENCOUNTER — Other Ambulatory Visit: Payer: Self-pay | Admitting: Family Medicine

## 2018-07-30 DIAGNOSIS — Z1231 Encounter for screening mammogram for malignant neoplasm of breast: Secondary | ICD-10-CM

## 2018-09-12 ENCOUNTER — Ambulatory Visit: Payer: 59

## 2018-09-19 ENCOUNTER — Other Ambulatory Visit: Payer: Self-pay

## 2018-09-19 ENCOUNTER — Ambulatory Visit
Admission: RE | Admit: 2018-09-19 | Discharge: 2018-09-19 | Disposition: A | Discharge status: Home / Self Care | Payer: 59 | Source: Ambulatory Visit | Attending: Family Medicine | Admitting: Family Medicine

## 2018-09-19 DIAGNOSIS — Z1231 Encounter for screening mammogram for malignant neoplasm of breast: Secondary | ICD-10-CM

## 2018-10-01 ENCOUNTER — Other Ambulatory Visit: Payer: 59 | Admitting: *Deleted

## 2018-10-01 ENCOUNTER — Other Ambulatory Visit: Payer: Self-pay

## 2018-10-01 DIAGNOSIS — R931 Abnormal findings on diagnostic imaging of heart and coronary circulation: Secondary | ICD-10-CM

## 2018-10-01 DIAGNOSIS — E782 Mixed hyperlipidemia: Secondary | ICD-10-CM

## 2018-10-01 DIAGNOSIS — I10 Essential (primary) hypertension: Secondary | ICD-10-CM

## 2018-10-01 DIAGNOSIS — Z79899 Other long term (current) drug therapy: Secondary | ICD-10-CM

## 2018-10-01 LAB — LIPID PANEL
Chol/HDL Ratio: 2.6 ratio (ref 0.0–4.4)
Cholesterol, Total: 140 mg/dL (ref 100–199)
HDL: 53 mg/dL (ref >39)
LDL Calculated: 77 mg/dL (ref 0–99)
Triglycerides: 50 mg/dL (ref 0–149)
VLDL Cholesterol Cal: 10 mg/dL (ref 5–40)

## 2018-10-01 LAB — BASIC METABOLIC PANEL
BUN/Creatinine Ratio: 22 (ref 9–23)
BUN: 17 mg/dL (ref 6–24)
CO2: 23 mmol/L (ref 20–29)
Calcium: 9.5 mg/dL (ref 8.7–10.2)
Chloride: 103 mmol/L (ref 96–106)
Creatinine, Ser: 0.77 mg/dL (ref 0.57–1.00)
GFR calc Af Amer: 98 mL/min/{1.73_m2} (ref >59)
GFR calc non Af Amer: 85 mL/min/{1.73_m2} (ref >59)
Glucose: 117 mg/dL — ABNORMAL HIGH (ref 65–99)
Potassium: 3.9 mmol/L (ref 3.5–5.2)
Sodium: 141 mmol/L (ref 134–144)

## 2018-10-01 LAB — HEPATIC FUNCTION PANEL
ALT: 22 IU/L (ref 0–32)
AST: 18 IU/L (ref 0–40)
Albumin: 4.5 g/dL (ref 3.8–4.9)
Alkaline Phosphatase: 58 IU/L (ref 39–117)
Bilirubin Total: 0.4 mg/dL (ref 0.0–1.2)
Bilirubin, Direct: 0.12 mg/dL (ref 0.00–0.40)
Total Protein: 6.4 g/dL (ref 6.0–8.5)

## 2018-10-31 ENCOUNTER — Telehealth: Payer: Self-pay | Admitting: Cardiovascular Disease

## 2018-10-31 DIAGNOSIS — Z8249 Family history of ischemic heart disease and other diseases of the circulatory system: Secondary | ICD-10-CM

## 2018-10-31 DIAGNOSIS — E782 Mixed hyperlipidemia: Secondary | ICD-10-CM

## 2018-10-31 DIAGNOSIS — R931 Abnormal findings on diagnostic imaging of heart and coronary circulation: Secondary | ICD-10-CM

## 2018-10-31 NOTE — Telephone Encounter (Signed)
New message   Patient would like to discuss a medication change in reference to being a different medication that patient was put on by PCP.

## 2018-10-31 NOTE — Telephone Encounter (Signed)
Spoke with patient who states she saw her PCP, Dr. Lindell Noe, recently and was telling her about her leg pain since starting rosuvastatin. Dr. Lindell Noe advised her to switch to pravastatin to see if symptoms resolve. Patient called to ask Dr. Elmarie Shiley opinion about this. I advised that Dr. Acie Fredrickson advises patients that pravastatin is not as strong as rosuvastatin and his regimen is usually to try to tolerate rosuvastatin a few days per week in the place of daily pravastatin. I asked patient if she is taking ezetimibe and she states she did take it for a while and then stopped because she thought it was making the leg pain worse. She has been taking rosuvastatin 5 mg 4 days per week. I advised that we could schedule an appointment with Lipid clinic to discuss potential PCSK-9 inhibitor therapy or we could try a different medication regimen. She states she would like to try the pravastatin to see how she feels. She agrees to add the ezetimibe 10 mg back into her daily regimen when she starts pravastatin 20 mg. I scheduled a lab appointment and follow-up with Dr. Acie Fredrickson for 3 months. I advised her to call back prior to her appointment with questions or concerns. She verbalized understanding and agreement and thanked me for the call.

## 2018-11-02 NOTE — Telephone Encounter (Signed)
Agree with the plan ( and the various options ) outlined by Christen Bame, RN.

## 2019-01-09 ENCOUNTER — Other Ambulatory Visit: Payer: 59 | Admitting: *Deleted

## 2019-01-09 ENCOUNTER — Other Ambulatory Visit: Payer: Self-pay

## 2019-01-09 DIAGNOSIS — E782 Mixed hyperlipidemia: Secondary | ICD-10-CM

## 2019-01-09 DIAGNOSIS — R931 Abnormal findings on diagnostic imaging of heart and coronary circulation: Secondary | ICD-10-CM

## 2019-01-09 DIAGNOSIS — Z8249 Family history of ischemic heart disease and other diseases of the circulatory system: Secondary | ICD-10-CM

## 2019-01-13 LAB — BASIC METABOLIC PANEL
BUN/Creatinine Ratio: 20 (ref 9–23)
BUN: 16 mg/dL (ref 6–24)
CO2: 25 mmol/L (ref 20–29)
Calcium: 9.4 mg/dL (ref 8.7–10.2)
Chloride: 99 mmol/L (ref 96–106)
Creatinine, Ser: 0.82 mg/dL (ref 0.57–1.00)
GFR calc Af Amer: 91 mL/min/{1.73_m2} (ref >59)
GFR calc non Af Amer: 79 mL/min/{1.73_m2} (ref >59)
Glucose: 102 mg/dL — ABNORMAL HIGH (ref 65–99)
Potassium: 4.1 mmol/L (ref 3.5–5.2)
Sodium: 138 mmol/L (ref 134–144)

## 2019-01-13 LAB — HEPATIC FUNCTION PANEL
ALT: 27 IU/L (ref 0–32)
AST: 21 IU/L (ref 0–40)
Albumin: 4.5 g/dL (ref 3.8–4.9)
Alkaline Phosphatase: 61 IU/L (ref 39–117)
Bilirubin Total: 0.5 mg/dL (ref 0.0–1.2)
Bilirubin, Direct: 0.17 mg/dL (ref 0.00–0.40)
Total Protein: 6.5 g/dL (ref 6.0–8.5)

## 2019-01-13 LAB — NMR LIPOPROF + GRAPH
Cholesterol, Total: 152 mg/dL (ref 100–199)
HDL Particle Number: 42.8 umol/L (ref >=30.5)
HDL-C: 56 mg/dL (ref >39)
LDL Particle Number: 1236 nmol/L — ABNORMAL HIGH (ref <1000)
LDL Size: 20.5 nm — ABNORMAL LOW (ref >20.5)
LDL-C (NIH Calc): 80 mg/dL (ref 0–99)
LP-IR Score: 64 — ABNORMAL HIGH (ref <=45)
Small LDL Particle Number: 612 nmol/L — ABNORMAL HIGH (ref <=527)
Triglycerides: 87 mg/dL (ref 0–149)

## 2019-01-14 ENCOUNTER — Telehealth (INDEPENDENT_AMBULATORY_CARE_PROVIDER_SITE_OTHER): Payer: 59 | Admitting: Cardiovascular Disease

## 2019-01-14 ENCOUNTER — Encounter: Payer: Self-pay | Admitting: Cardiovascular Disease

## 2019-01-14 ENCOUNTER — Other Ambulatory Visit: Payer: Self-pay

## 2019-01-14 VITALS — Ht 68.0 in | Wt 211.0 lb

## 2019-01-14 DIAGNOSIS — R931 Abnormal findings on diagnostic imaging of heart and coronary circulation: Secondary | ICD-10-CM | POA: Diagnosis not present

## 2019-01-14 DIAGNOSIS — E782 Mixed hyperlipidemia: Secondary | ICD-10-CM

## 2019-01-14 DIAGNOSIS — I10 Essential (primary) hypertension: Secondary | ICD-10-CM

## 2019-01-14 DIAGNOSIS — Z8249 Family history of ischemic heart disease and other diseases of the circulatory system: Secondary | ICD-10-CM

## 2019-01-14 NOTE — Patient Instructions (Signed)
Medication Instructions:  Your physician recommends that you continue on your current medications as directed. Please refer to the Current Medication list given to you today.  *If you need a refill on your cardiac medications before your next appointment, please call your pharmacy*  Lab Work: None Ordered   Testing/Procedures: None Ordered   Follow-Up: You have been referred to Lipid Clinic for management of your high cholesterol   At [facility], you and your health needs are our priority.  As part of our continuing mission to provide you with exceptional heart care, we have created designated Provider Care Teams.  These Care Teams include your primary Cardiologist (physician) and Advanced Practice Providers (APPs -  Physician Assistants and Nurse Practitioners) who all work together to provide you with the care you need, when you need it.  Your next appointment:   6 month(s)  The format for your next appointment:   Either In Person or Virtual  Provider:   Richardson Dopp, PA-C, Robbie Lis, PA-C or Daune Perch, NP

## 2019-01-14 NOTE — Progress Notes (Signed)
Virtual Visit via Video Note   This visit type was conducted due to national recommendations for restrictions regarding the COVID-19 Pandemic (e.g. social distancing) in an effort to limit this patient's exposure and mitigate transmission in our community.  Due to her co-morbid illnesses, this patient is at least at moderate risk for complications without adequate follow up.  This format is felt to be most appropriate for this patient at this time.  All issues noted in this document were discussed and addressed.  A limited physical exam was performed with this format.  Please refer to the patient's chart for her consent to telehealth for [facility].   Date:  01/15/2019   ID:  Michele Greer, DOB [birth year], MRN [id number]  Patient Location: Home Provider Location: Office  PCP:  Glenis Smoker, MD  Cardiologist:  Mertie Moores, MD  Electrophysiologist:  None   Evaluation Performed:  Follow-Up Visit  Chief Complaint:  Hyperlipidemia    Dec. 9, 2020   Michele Greer is a 58 y.o. female with history of hyperlipidemia and a strong family history of coronary artery disease.  Her coronary calcium score is 5 which is 73 percentile of age/sex matched controls.  She is seen today via telemedicine visit for follow-up of her hyperlipidemia.  She has been on Pravachol but her recent NMR lipid panel revealed an elevated LDL particle number and we have suggested that she would probably do better on rosuvastatin.  Doing well .   Her last NMR lipid profille shwed an elevate LDL particle number  Saw a new primary MD recently  Has been having some muscle aches on rosuvastatin .   Has been taking the rosuvastatin 4 days a week and zetia 10 mg 4 days a week .    We discussed PCSK-9 inhibitor   The patient does not have symptoms concerning for COVID-19 infection (fever, chills, cough, or new shortness of breath).    Past Medical History:  Diagnosis Date  . Anxiety   . Hypertension     No past surgical history on file.   Current Meds  Medication Sig  . citalopram (CELEXA) 10 MG tablet Take 10 mg by mouth daily.  Marland Kitchen ezetimibe (ZETIA) 10 MG tablet Take 1 tablet (10 mg total) by mouth daily.  . Ibuprofen 200 MG CAPS Take 2 capsules by mouth as needed (pain).   Marland Kitchen lisinopril-hydrochlorothiazide (ZESTORETIC) 20-25 MG tablet Take 1 tablet by mouth daily.  . Multiple Vitamin (MULTIVITAMIN) tablet Take 1 tablet by mouth daily.  . rosuvastatin (CRESTOR) 5 MG tablet Take 5 mg by mouth. 4 days a weeks  . TURMERIC PO Take 1 tablet by mouth daily.     Allergies:   Patient has no known allergies.   Social History   Tobacco Use  . Smoking status: Never Smoker  . Smokeless tobacco: Never Used  Substance Use Topics  . Alcohol use: Yes    Alcohol/week: 1.0 standard drinks    Types: 1 Glasses of wine per week  . Drug use: No     Family Hx: The patient's family history includes Bone cancer in her father; Cancer - Colon (age of onset: 54) in her sister; Heart attack in her brother; Hypertension in her father and mother; Osteoarthritis in her mother; Thyroid disease in her mother.  ROS:   Please see the history of present illness.     All other systems reviewed and are negative.   Prior CV studies:   The following studies were reviewed  today:    Labs/Other Tests and Data Reviewed:    EKG:  No ECG reviewed.  Recent Labs: 01/09/2019: ALT 27; BUN 16; Creatinine, Ser 0.82; Potassium 4.1; Sodium 138   Recent Lipid Panel Lab Results  Component Value Date/Time   CHOL 140 10/01/2018 08:06 AM   TRIG 50 10/01/2018 08:06 AM   HDL 53 10/01/2018 08:06 AM   CHOLHDL 2.6 10/01/2018 08:06 AM   LDLCALC 77 10/01/2018 08:06 AM    Wt Readings from Last 3 Encounters:  01/14/19 211 lb (95.7 kg)  05/08/18 213 lb (96.6 kg)  01/22/18 216 lb 1.9 oz (98 kg)     Objective:    Vital Signs:  Ht 5\' 8"  (1.727 m)   Wt 211 lb (95.7 kg)   LMP 09/20/2011 (Exact Date)   BMI 32.08 kg/m     VITAL SIGNS:  reviewed GEN:  no acute distress EYES:  sclerae anicteric, EOMI - Extraocular Movements Intact RESPIRATORY:  normal respiratory effort, symmetric expansion CARDIOVASCULAR:  no peripheral edema SKIN:  no rash, lesions or ulcers. MUSCULOSKELETAL:  no obvious deformities. NEURO:  alert and oriented x 3, no obvious focal deficit PSYCH:  normal affect  ASSESSMENT & PLAN:    1. Hyperlipidemia: Michele Greer continues to have mildly elevated lipids.  She does not tolerate rosuvastatin and I do not think that any other statin will come close to treating her adequately.  We will refer her to the lipid clinic for further evaluation.  I suspect that she will be a good candidate for PCSK9 inhibitor given her very strong family history of premature coronary artery disease.  COVID-19 Education: The signs and symptoms of COVID-19 were discussed with the patient and how to seek care for testing (follow up with PCP or arrange E-visit).  The importance of social distancing was discussed today.  Time:   Today, I have spent  18  minutes with the patient with telehealth technology discussing the above problems.     Medication Adjustments/Labs and Tests Ordered: Current medicines are reviewed at length with the patient today.  Concerns regarding medicines are outlined above.   Tests Ordered: Orders Placed This Encounter  Procedures  . AMB Referral to [facility]    Medication Changes: No orders of the defined types were placed in this encounter.   Follow Up:  Virtual Visit  in 1 year(s)  This note is not being shared with the patient for the following reason: To respect privacy (The patient or proxy has requested that the information not be shared).  Note sharing was not required on Dec. 9 which is the day this note was written    Signed, Mertie Moores, MD  01/15/2019 5:37 PM    [employer]

## 2019-01-15 ENCOUNTER — Encounter: Payer: Self-pay | Admitting: Cardiovascular Disease

## 2019-01-15 DIAGNOSIS — R931 Abnormal findings on diagnostic imaging of heart and coronary circulation: Secondary | ICD-10-CM | POA: Insufficient documentation

## 2019-01-28 ENCOUNTER — Ambulatory Visit: Payer: 59

## 2019-02-19 ENCOUNTER — Encounter: Payer: Self-pay | Admitting: General Practice

## 2019-03-13 ENCOUNTER — Ambulatory Visit: Payer: 59 | Admitting: Internal Medicine

## 2019-04-05 ENCOUNTER — Ambulatory Visit: Payer: 59 | Attending: Internal Medicine

## 2019-04-05 DIAGNOSIS — Z23 Encounter for immunization: Secondary | ICD-10-CM | POA: Insufficient documentation

## 2019-04-05 NOTE — Progress Notes (Signed)
   Covid-19 Vaccination Clinic  Name:  Chauncy Dondlinger    MRN: [id number] DOB: 1960/08/27  04/05/2019  Ms. Puopolo was observed post Covid-19 immunization for 15 minutes without incidence. She was provided with Vaccine Information Sheet and instruction to access the V-Safe system.   Ms. Mulrooney was instructed to call 911 with any severe reactions post vaccine: Marland Kitchen Difficulty breathing  . Swelling of your face and throat  . A fast heartbeat  . A bad rash all over your body  . Dizziness and weakness    Immunizations Administered    Name Date Dose VIS Date Route   Pfizer COVID-19 Vaccine 04/05/2019  8:55 AM 0.3 mL 01/16/2019 Intramuscular   Manufacturer: [place]   Lot: [id number]   [place]: [id number]

## 2019-04-07 ENCOUNTER — Other Ambulatory Visit: Payer: Self-pay

## 2019-04-08 ENCOUNTER — Ambulatory Visit (INDEPENDENT_AMBULATORY_CARE_PROVIDER_SITE_OTHER): Payer: 59 | Admitting: Obstetrics and Gynecology

## 2019-04-08 ENCOUNTER — Encounter: Payer: Self-pay | Admitting: Obstetrics and Gynecology

## 2019-04-08 VITALS — BP 120/76 | HR 70 | Temp 97.0°F | Resp 16 | Ht 65.5 in | Wt 213.2 lb

## 2019-04-08 DIAGNOSIS — Z01419 Encounter for gynecological examination (general) (routine) without abnormal findings: Secondary | ICD-10-CM | POA: Diagnosis not present

## 2019-04-08 NOTE — Patient Instructions (Signed)

## 2019-04-08 NOTE — Progress Notes (Signed)
59 y.o. G46P0003 Married Caucasian female here for annual exam.    No vaginal bleeding or spotting.  Some vaginal dryness.  Not bothersome.   Just saw her PCP and had her labs done.   PCP:  Sela Hilding, MD   Patient's last menstrual period was 09/20/2011 (exact date).           Sexually active: Yes.    The current method of family planning is vasectomy.    Exercising: Yes.    walks 3-4 days weekly Smoker:  no  Health Maintenance: Pap: 01-06-18 Neg:Neg HR HPV, 05-02-12 Neg:Neg HR HPV, 09-30-09 Neg History of abnormal Pap:  no MMG: 09-19-18 3D/Neg/density B/BiRads1 Colonoscopy: 04/2016 polyps--[facility];next due 04/2021(FH colon cancer in twin sister) BMD: 2002 Result :normal TDaP: PCP Gardasil:   no HIV: 02-11-15 NR Hep C:02-11-15 Neg Screening Labs:  PCP Covid:  First vaccine completed.    reports that she has never smoked. She has never used smokeless tobacco. She reports current alcohol use of about 1.0 standard drinks of alcohol per week. She reports that she does not use drugs.  Past Medical History:  Diagnosis Date  . Anxiety   . Hypertension     History reviewed. No pertinent surgical history.  Current Outpatient Medications  Medication Sig Dispense Refill  . citalopram (CELEXA) 10 MG tablet Take 10 mg by mouth daily.  3  . Ibuprofen 200 MG CAPS Take 2 capsules by mouth as needed (pain).     Marland Kitchen lisinopril-hydrochlorothiazide (ZESTORETIC) 20-25 MG tablet Take 1 tablet by mouth daily.    . Multiple Vitamin (MULTIVITAMIN) tablet Take 1 tablet by mouth daily.    . rosuvastatin (CRESTOR) 5 MG tablet Take 5 mg by mouth. 4 days a weeks    . TURMERIC PO Take 1 tablet by mouth daily.     No current facility-administered medications for this visit.    Family History  Problem Relation Age of Onset  . Hypertension Father   . Bone cancer Father        deceased  . Osteoarthritis Mother   . Hypertension Mother   . Thyroid disease Mother   . Cancer - Colon Sister 85   twin sister  . Heart attack Brother     Review of Systems  All other systems reviewed and are negative.   Exam:   BP 120/76 (Cuff Size: Large)   Pulse 70   Temp (!) 97 F (36.1 C) (Temporal)   Resp 16   Ht 5' 5.5" (1.664 m)   Wt 213 lb 3.2 oz (96.7 kg)   LMP 09/20/2011 (Exact Date)   BMI 34.94 kg/m     General appearance: alert, cooperative and appears stated age Head: normocephalic, without obvious abnormality, atraumatic Neck: no adenopathy, supple, symmetrical, trachea midline and thyroid normal to inspection and palpation Lungs: clear to auscultation bilaterally Breasts: normal appearance, no masses or tenderness, No nipple retraction or dimpling, No nipple discharge or bleeding, No axillary adenopathy Heart: regular rate and rhythm Abdomen: soft, non-tender; no masses, no organomegaly Extremities: extremities normal, atraumatic, no cyanosis or edema Skin: skin color, texture, turgor normal. No rashes or lesions Lymph nodes: cervical, supraclavicular, and axillary nodes normal. Neurologic: grossly normal  Pelvic: External genitalia:  no lesions              No abnormal inguinal nodes palpated.              Urethra:  normal appearing urethra with no masses, tenderness or lesions  Bartholins and Skenes: normal                 Vagina: normal appearing vagina with normal color and discharge, no lesions              Cervix: no lesions              Pap taken: No. Bimanual Exam:  Uterus:  normal size, contour, position, consistency, mobility, non-tender              Adnexa: no mass, fullness, tenderness              Rectal exam: Yes.  .  Confirms.              Anus:  normal sphincter tone, no lesions  Chaperone was present for exam.  Assessment:   Well woman visit with normal exam. FH colon cancer in twin sister.   Plan: Mammogram screening discussed. Self breast awareness reviewed. Pap and HR HPV as above. Guidelines for Calcium, Vitamin D, regular  exercise program including cardiovascular and weight bearing exercise. We discussed Shingrix vaccine after she has completed her Covid vaccine.  Follow up annually and prn.   After visit summary provided.

## 2019-04-29 ENCOUNTER — Ambulatory Visit: Payer: 59

## 2019-05-12 ENCOUNTER — Other Ambulatory Visit: Payer: Self-pay | Admitting: Cardiovascular Disease

## 2019-05-12 NOTE — Telephone Encounter (Signed)
Since pt does not tolerate rosuvastatin very well, do you want her to continue this medication? Please advise. Thanks

## 2019-05-13 ENCOUNTER — Ambulatory Visit: Payer: 59 | Attending: Internal Medicine

## 2019-05-13 DIAGNOSIS — Z23 Encounter for immunization: Secondary | ICD-10-CM

## 2019-05-13 NOTE — Progress Notes (Signed)
   Covid-19 Vaccination Clinic  Name:  Michele Greer    MRN: [id number] DOB: [birth year]  05/13/2019  [name] was observed post Covid-19 immunization for 15 minutes without incident. She was provided with Vaccine Information Sheet and instruction to access the V-Safe system.   [name] was instructed to call 911 with any severe reactions post vaccine: Marland Kitchen Difficulty breathing  . Swelling of face and throat  . A fast heartbeat  . A bad rash all over body  . Dizziness and weakness   Immunizations Administered    Name Date Dose VIS Date Route   Pfizer COVID-19 Vaccine 05/13/2019 11:17 AM 0.03 mL 01/16/2019 Intramuscular   Manufacturer: [employer], [employer]   Lot: [id number]   [place]: [id number]

## 2019-05-22 ENCOUNTER — Telehealth: Payer: Self-pay | Admitting: Cardiovascular Disease

## 2019-05-22 NOTE — Telephone Encounter (Signed)
Michele Greer is calling wanting to know if Dr. Acie Fredrickson is wanting her to have fasting labs before or during her appointment scheduled for 07/15/19. Please advise.

## 2019-05-22 NOTE — Telephone Encounter (Signed)
I spoke to the patient and recommended that she fasts the morning of her appointment with Dr Acie Fredrickson, in case he would like to draw labs.  She verbalized understanding.

## 2019-07-15 ENCOUNTER — Ambulatory Visit: Payer: 59 | Admitting: Cardiovascular Disease

## 2019-08-06 ENCOUNTER — Encounter: Payer: Self-pay | Admitting: Cardiovascular Disease

## 2019-08-06 NOTE — Progress Notes (Signed)
Cardiology Office Note:    Date:  08/07/2019   ID:  Michele Greer, DOB 1960/04/11, MRN [id number]  PCP:  Glenis Smoker, MD  Cardiologist:  Devanie Galanti  Electrophysiologist:  None   Referring MD: Glenis Smoker, *   Chief Complaint  Patient presents with  . Hyperlipidemia    Problem List 1. Essential HTN 2  Anxiety   History of Present Illness:    Michele Greer is a 59 y.o. female with a hx of HTN   She is here to discuss cardiac risk factors. Father had 2 MI Mother has  AVR Brother had an MI   No CP,   Walks regularly ,   3 days a week .   No DOE  No syncope Some shortness of breath climbing to top of hanging rock   August 07, 2019: Michele Greer  is seen today for follow-up visit.  She has a history of coronary calcifications and hyperlipidemia. Coronary calcium score of 5. This was 23 rd percentile for age and sex matched control.  Based on her presence of mild coronary artery disease we started her on rosuvastatin 5 mg a day. Last LDL particle number was 1236.  Her LDL is 80.  She is scheduled for repeat lipid levels this visit.  Tried taking zetia with the rosuvastatin but developed some MSK discomfort.  Getting some exercise  Could do more .   Diet has been ok.  Not as good for the past several weeks.   Past Medical History:  Diagnosis Date  . Anxiety   . Hypertension     History reviewed. No pertinent surgical history.  Current Medications: Current Meds  Medication Sig  . citalopram (CELEXA) 10 MG tablet Take 10 mg by mouth daily.  . Ibuprofen 200 MG CAPS Take 2 capsules by mouth as needed (pain).   Marland Kitchen lisinopril-hydrochlorothiazide (ZESTORETIC) 20-25 MG tablet Take 1 tablet by mouth daily.  . Multiple Vitamin (MULTIVITAMIN) tablet Take 1 tablet by mouth daily.  . rosuvastatin (CRESTOR) 5 MG tablet TAKE 1 TABLET BY MOUTH EVERY DAY  . TURMERIC PO Take 1 tablet by mouth daily.     Allergies:   Patient has no known allergies.   Social History    Socioeconomic History  . Marital status: Married    Spouse name: Not on file  . Number of children: Not on file  . Years of education: Not on file  . Highest education level: Not on file  Occupational History  . Not on file  Tobacco Use  . Smoking status: Never Smoker  . Smokeless tobacco: Never Used  Vaping Use  . Vaping Use: Never used  Substance and Sexual Activity  . Alcohol use: Yes    Alcohol/week: 1.0 standard drink    Types: 1 Glasses of wine per week  . Drug use: No  . Sexual activity: Yes    Birth control/protection: Other-see comments    Comment: vasectomy  Other Topics Concern  . Not on file  Social History Narrative  . Not on file   Social Determinants of Health   Financial Resource Strain:   . Difficulty of Paying Living Expenses:   Food Insecurity:   . Worried About Charity fundraiser in the Last Year:   . Arboriculturist in the Last Year:   Transportation Needs:   . Film/video editor (Medical):   Marland Kitchen Lack of Transportation (Non-Medical):   Physical Activity:   . Days of Exercise per Week:   .  Minutes of Exercise per Session:   Stress:   . Feeling of Stress :   Social Connections:   . Frequency of Communication with Friends and Family:   . Frequency of Social Gatherings with Friends and Family:   . Attends Religious Services:   . Active Member of Clubs or Organizations:   . Attends Archivist Meetings:   Marland Kitchen Marital Status:      Family History: The patient's family history includes Bone cancer in her father; Cancer - Colon (age of onset: 55) in her sister; Heart attack in her brother; Hypertension in her father and mother; Osteoarthritis in her mother; Thyroid disease in her mother.  ROS:   Please see the history of present illness.     All other systems reviewed and are negative.  EKGs/Labs/Other Studies Reviewed:    The following studies were reviewed today:    Recent Labs: 01/09/2019: ALT 27; BUN 16; Creatinine, Ser  0.82; Potassium 4.1; Sodium 138  Recent Lipid Panel    Component Value Date/Time   CHOL 140 10/01/2018 0806   TRIG 50 10/01/2018 0806   HDL 53 10/01/2018 0806   CHOLHDL 2.6 10/01/2018 0806   LDLCALC 77 10/01/2018 0806    Physical Exam:    Physical Exam: Blood pressure 132/90, pulse 60, height 5\' 8"  (1.727 m), weight 220 lb (99.8 kg), last menstrual period 09/20/2011, SpO2 97 %.  GEN:  Well nourished, well developed in no acute distress HEENT: Normal NECK: No JVD; No carotid bruits LYMPHATICS: No lymphadenopathy CARDIAC: RRR , no murmurs, rubs, gallops RESPIRATORY:  Clear to auscultation without rales, wheezing or rhonchi  ABDOMEN: Soft, non-tender, non-distended MUSCULOSKELETAL:  No edema; No deformity  SKIN: Warm and dry NEUROLOGIC:  Alert and oriented x 3  EKG:     August 07, 2019:   NSR at 61,  No ST or T abn.  No changes from previous ecg   ASSESSMENT:    1. Elevated coronary artery calcium score   2. Mixed hyperlipidemia   3. Family history of early CAD   42. Medication management    PLAN:    In order of problems listed above:  1.  Hyperlipidemia:   With a  Strong Family history of cardiac disease:   Is tolerating rosuvastatin 5.   She has Zetia which we might need to add Check lipid NMR today , liver ezn, bmp today  2.  Essential hypertension: . Diastolic bp is mildly elevated.   Admits she was off her diet this past week - vacation .    Medication Adjustments/Labs and Tests Ordered: Current medicines are reviewed at length with the patient today.  Concerns regarding medicines are outlined above.  Orders Placed This Encounter  Procedures  . Apolipoprotein B  . Hepatic function panel  . NMR LipoProf + Graph  . Basic Metabolic Panel (BMET)  . EKG 12-Lead   No orders of the defined types were placed in this encounter.   Patient Instructions  Medication Instructions:  Your physician recommends that you continue on your current medications as directed.  Please refer to the Current Medication list given to you today.  *If you need a refill on your cardiac medications before your next appointment, please call your pharmacy*   Lab Work: TODAY - apolipoprotein B, NMR with lipids, liver panel, BMET If you have labs (blood work) drawn today and your tests are completely normal, you will receive your results only by: Marland Kitchen MyChart Message (if you have MyChart) OR .  A paper copy in the mail If you have any lab test that is abnormal or we need to change your treatment, we will call you to review the results.   Testing/Procedures: None Ordered   Follow-Up: At [facility], you and your health needs are our priority.  As part of our continuing mission to provide you with exceptional heart care, we have created designated Provider Care Teams.  These Care Teams include your primary Cardiologist (physician) and Advanced Practice Providers (APPs -  Physician Assistants and Nurse Practitioners) who all work together to provide you with the care you need, when you need it.   Your next appointment:   1 year(s)  The format for your next appointment:   In Person  Provider:   You may see Mertie Moores, MD or one of the following Advanced Practice Providers on your designated Care Team:    Richardson Dopp, PA-C  Robbie Lis, [place]        Signed, Mertie Moores, MD  08/07/2019 9:02 AM    [place]

## 2019-08-07 ENCOUNTER — Ambulatory Visit: Payer: 59 | Admitting: Cardiovascular Disease

## 2019-08-07 ENCOUNTER — Other Ambulatory Visit: Payer: Self-pay

## 2019-08-07 ENCOUNTER — Encounter: Payer: Self-pay | Admitting: Cardiovascular Disease

## 2019-08-07 VITALS — BP 132/90 | HR 60 | Ht 68.0 in | Wt 220.0 lb

## 2019-08-07 DIAGNOSIS — R931 Abnormal findings on diagnostic imaging of heart and coronary circulation: Secondary | ICD-10-CM

## 2019-08-07 DIAGNOSIS — Z79899 Other long term (current) drug therapy: Secondary | ICD-10-CM

## 2019-08-07 DIAGNOSIS — E782 Mixed hyperlipidemia: Secondary | ICD-10-CM | POA: Diagnosis not present

## 2019-08-07 DIAGNOSIS — Z8249 Family history of ischemic heart disease and other diseases of the circulatory system: Secondary | ICD-10-CM | POA: Diagnosis not present

## 2019-08-07 NOTE — Patient Instructions (Addendum)
Medication Instructions:  Your physician recommends that you continue on your current medications as directed. Please refer to the Current Medication list given to you today.  *If you need a refill on your cardiac medications before your next appointment, please call your pharmacy*   Lab Work: TODAY - apolipoprotein B, NMR with lipids, liver panel, BMET If you have labs (blood work) drawn today and your tests are completely normal, you will receive your results only by: Marland Kitchen MyChart Message (if you have MyChart) OR . A paper copy in the mail If you have any lab test that is abnormal or we need to change your treatment, we will call you to review the results.   Testing/Procedures: None Ordered   Follow-Up: At [facility], you and your health needs are our priority.  As part of our continuing mission to provide you with exceptional heart care, we have created designated Provider Care Teams.  These Care Teams include your primary Cardiologist (physician) and Advanced Practice Providers (APPs -  Physician Assistants and Nurse Practitioners) who all work together to provide you with the care you need, when you need it.   Your next appointment:   1 year(s)  The format for your next appointment:   In Person  Provider:   You may see Mertie Moores, MD or one of the following Advanced Practice Providers on your designated Care Team:    Richardson Dopp, PA-C  [place], [place]

## 2019-08-08 LAB — BASIC METABOLIC PANEL
BUN/Creatinine Ratio: 20 (ref 12–28)
BUN: 16 mg/dL (ref 8–27)
CO2: 24 mmol/L (ref 20–29)
Calcium: 9.5 mg/dL (ref 8.7–10.3)
Chloride: 101 mmol/L (ref 96–106)
Creatinine, Ser: 0.8 mg/dL (ref 0.57–1.00)
GFR calc Af Amer: 93 mL/min/{1.73_m2} (ref >59)
GFR calc non Af Amer: 80 mL/min/{1.73_m2} (ref >59)
Glucose: 109 mg/dL — ABNORMAL HIGH (ref 65–99)
Potassium: 4.1 mmol/L (ref 3.5–5.2)
Sodium: 139 mmol/L (ref 134–144)

## 2019-08-08 LAB — HEPATIC FUNCTION PANEL
ALT: 30 IU/L (ref 0–32)
AST: 22 IU/L (ref 0–40)
Albumin: 4.7 g/dL (ref 3.8–4.9)
Alkaline Phosphatase: 61 IU/L (ref 48–121)
Bilirubin Total: 0.5 mg/dL (ref 0.0–1.2)
Bilirubin, Direct: 0.13 mg/dL (ref 0.00–0.40)
Total Protein: 6.4 g/dL (ref 6.0–8.5)

## 2019-08-08 LAB — NMR LIPOPROF + GRAPH
Cholesterol, Total: 174 mg/dL (ref 100–199)
HDL Particle Number: 43.5 umol/L (ref >=30.5)
HDL-C: 62 mg/dL (ref >39)
LDL Particle Number: 1266 nmol/L — ABNORMAL HIGH (ref <1000)
LDL Size: 20.6 nm (ref >20.5)
LDL-C (NIH Calc): 96 mg/dL (ref 0–99)
LP-IR Score: 56 — ABNORMAL HIGH (ref <=45)
Small LDL Particle Number: 492 nmol/L (ref <=527)
Triglycerides: 84 mg/dL (ref 0–149)

## 2019-08-08 LAB — APOLIPOPROTEIN B: Apolipoprotein B: 83 mg/dL (ref <90)

## 2019-08-19 ENCOUNTER — Telehealth: Payer: Self-pay | Admitting: Cardiovascular Disease

## 2019-08-19 DIAGNOSIS — E785 Hyperlipidemia, unspecified: Secondary | ICD-10-CM

## 2019-08-19 MED ORDER — EZETIMIBE 10 MG PO TABS: 10.0000 mg | ORAL_TABLET | Freq: Every day | ORAL | 3 refills | Status: DC

## 2019-08-19 NOTE — Telephone Encounter (Signed)
Patient called back. Informed her of Dr. Elmarie Shiley recommendations, agree with trial of increasing rosuvastatin and restarting zetia 10 mg a day and recheck NMR lipid profile , liver enz in 3 months. Patient will come in on 10/13 for fasting lab work.

## 2019-08-19 NOTE — Telephone Encounter (Signed)
-----   Message from Thayer Headings, MD sent at 08/13/2019 12:58 PM EDT ----- LDL particle number is still elevated.   The other labs are stable .  Still has some insulin resistance.   She can try rosuvastatin 10 mg a day but she has had difficulty tolerating this high of a dose in the past.  I would favor referring her to the lipid clinic for consideration of PCSK9 inhibitor Cont. Diet, exercise, weight loss efforts.

## 2019-11-06 ENCOUNTER — Other Ambulatory Visit: Payer: Self-pay | Admitting: Family Medicine

## 2019-11-06 DIAGNOSIS — Z0000 Encounter for general adult medical examination without abnormal findings: Secondary | ICD-10-CM

## 2019-11-11 ENCOUNTER — Other Ambulatory Visit: Payer: Self-pay

## 2019-11-11 ENCOUNTER — Ambulatory Visit
Admission: RE | Admit: 2019-11-11 | Discharge: 2019-11-11 | Disposition: A | Discharge status: Home / Self Care | Payer: 59 | Source: Ambulatory Visit | Attending: Family Medicine | Admitting: Family Medicine

## 2019-11-11 DIAGNOSIS — Z0000 Encounter for general adult medical examination without abnormal findings: Secondary | ICD-10-CM

## 2019-11-18 ENCOUNTER — Other Ambulatory Visit: Payer: 59 | Admitting: *Deleted

## 2019-11-18 ENCOUNTER — Other Ambulatory Visit: Payer: Self-pay

## 2019-11-18 DIAGNOSIS — E785 Hyperlipidemia, unspecified: Secondary | ICD-10-CM

## 2019-11-20 LAB — HEPATIC FUNCTION PANEL
ALT: 27 IU/L (ref 0–32)
AST: 22 IU/L (ref 0–40)
Albumin: 4.6 g/dL (ref 3.8–4.9)
Alkaline Phosphatase: 59 IU/L (ref 44–121)
Bilirubin Total: 0.5 mg/dL (ref 0.0–1.2)
Bilirubin, Direct: 0.16 mg/dL (ref 0.00–0.40)
Total Protein: 6.4 g/dL (ref 6.0–8.5)

## 2019-11-20 LAB — SPECIMEN STATUS REPORT

## 2019-11-20 LAB — NMR, LIPOPROFILE
Cholesterol, Total: 147 mg/dL (ref 100–199)
HDL Particle Number: 38.4 umol/L (ref >=30.5)
HDL-C: 54 mg/dL (ref >39)
LDL Particle Number: 965 nmol/L (ref <1000)
LDL Size: 20.3 nm — ABNORMAL LOW (ref >20.5)
LDL-C (NIH Calc): 79 mg/dL (ref 0–99)
LP-IR Score: 47 — ABNORMAL HIGH (ref <=45)
Small LDL Particle Number: 472 nmol/L (ref <=527)
Triglycerides: 69 mg/dL (ref 0–149)

## 2020-04-13 ENCOUNTER — Ambulatory Visit: Payer: 59 | Admitting: Obstetrics and Gynecology

## 2020-05-16 ENCOUNTER — Other Ambulatory Visit: Payer: Self-pay | Admitting: Cardiovascular Disease

## 2020-07-11 ENCOUNTER — Ambulatory Visit: Payer: 59 | Admitting: Obstetrics and Gynecology

## 2020-08-05 ENCOUNTER — Ambulatory Visit: Payer: Self-pay | Admitting: Cardiovascular Disease

## 2020-08-27 IMAGING — MG DIGITAL SCREENING BILATERAL MAMMOGRAM WITH TOMO AND CAD
8 series · 8 of 24 positions shown · non-contrast
Comparison: Previous exam(s).

CLINICAL DATA: Screening.

EXAM:
DIGITAL SCREENING BILATERAL MAMMOGRAM WITH TOMO AND CAD

[R MLO synth-2D]
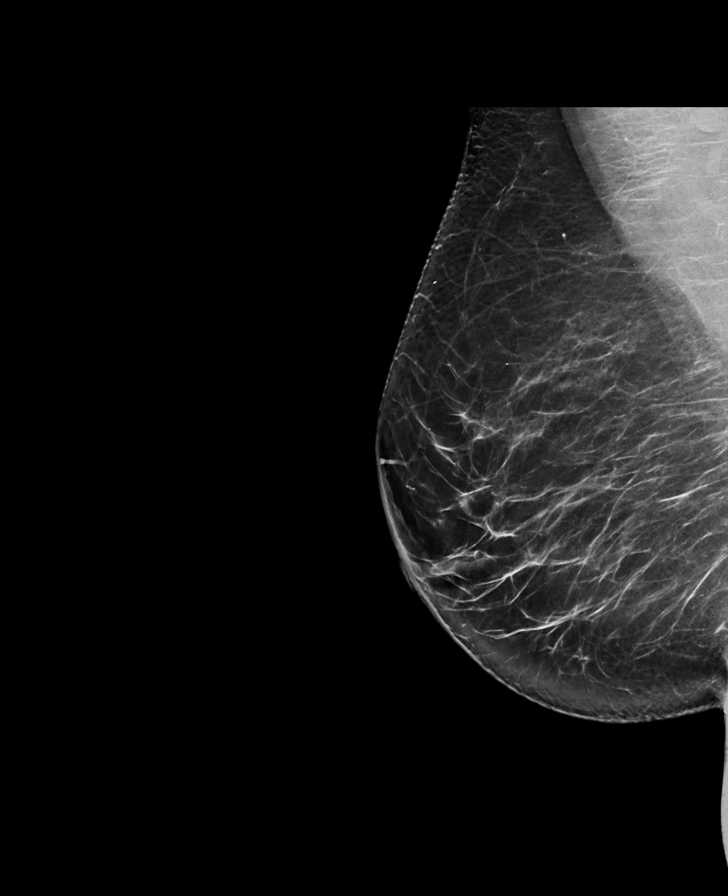

[R CC synth-2D]
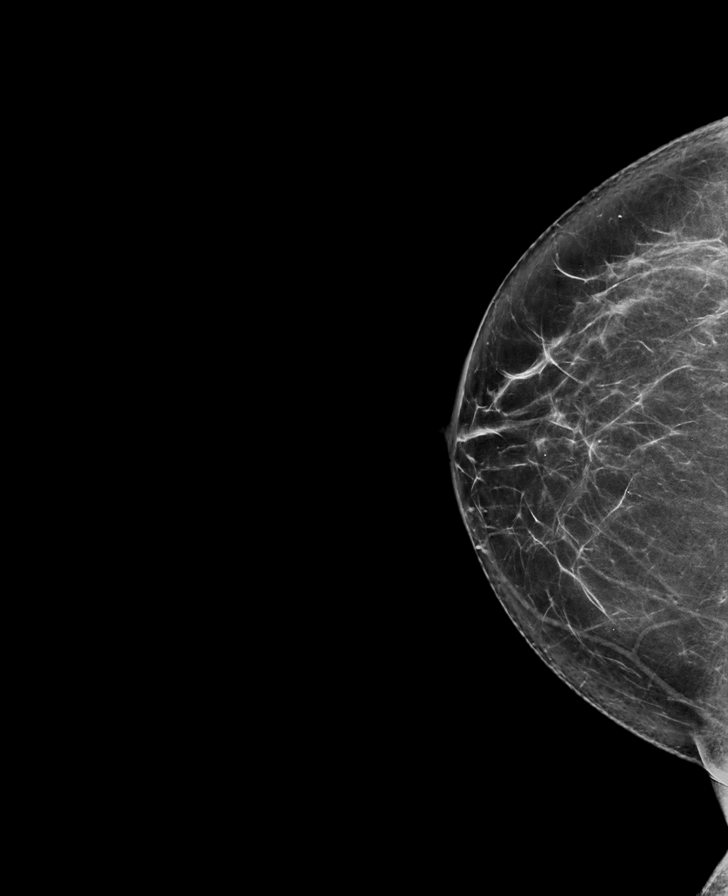

[L MLO synth-2D]
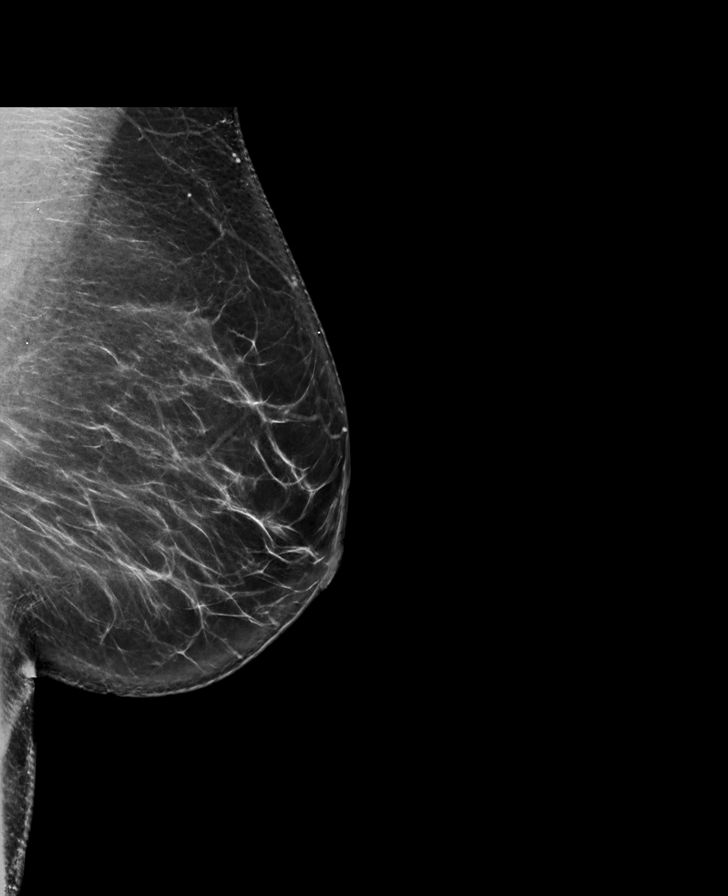

[L CC synth-2D]
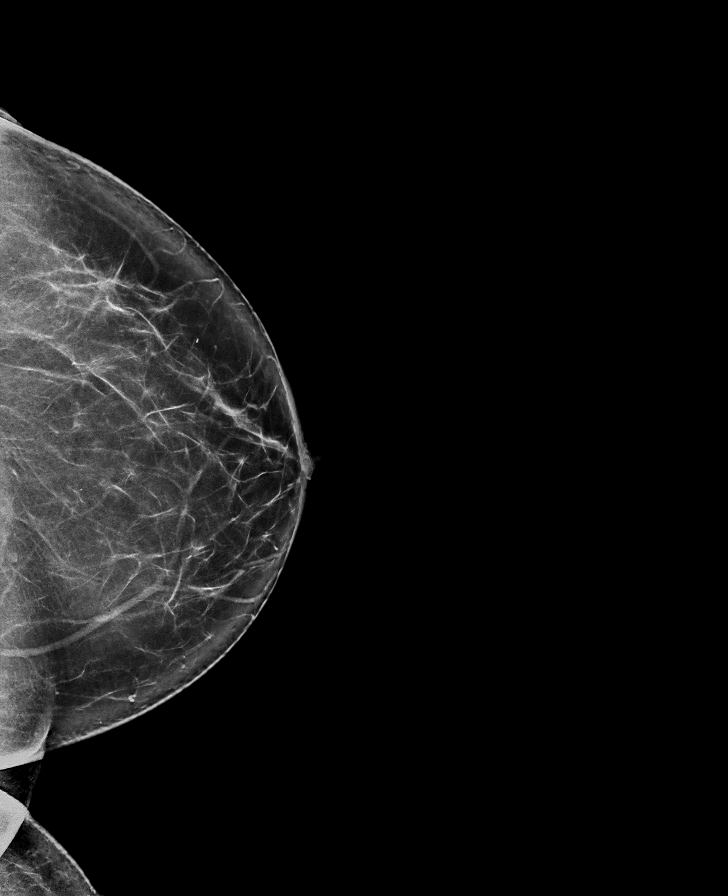

[R MLO tomo · tomo slice 45/90.0]
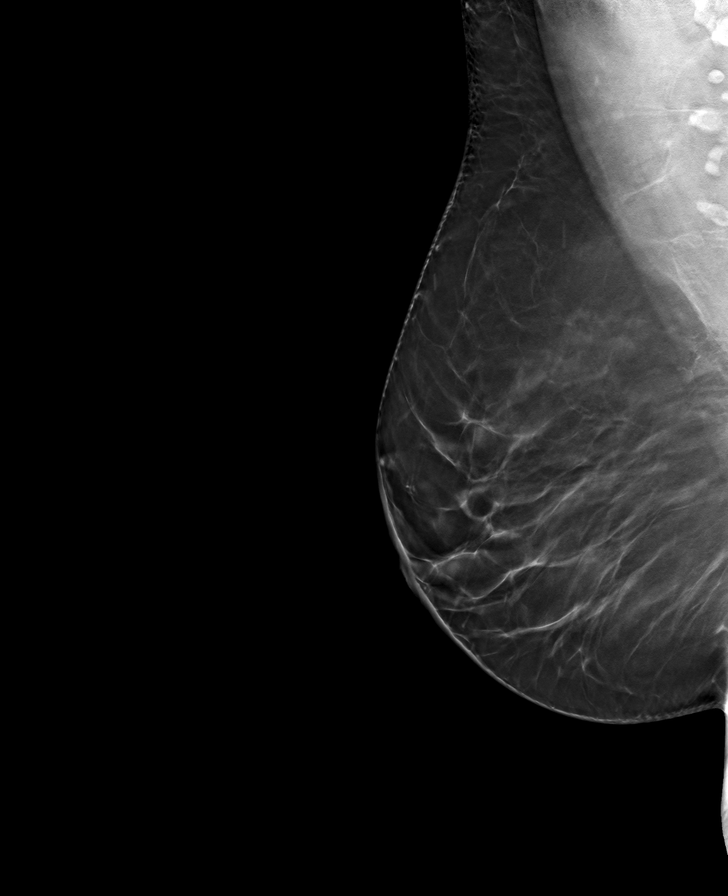

[R CC tomo · tomo slice 39/78.0]
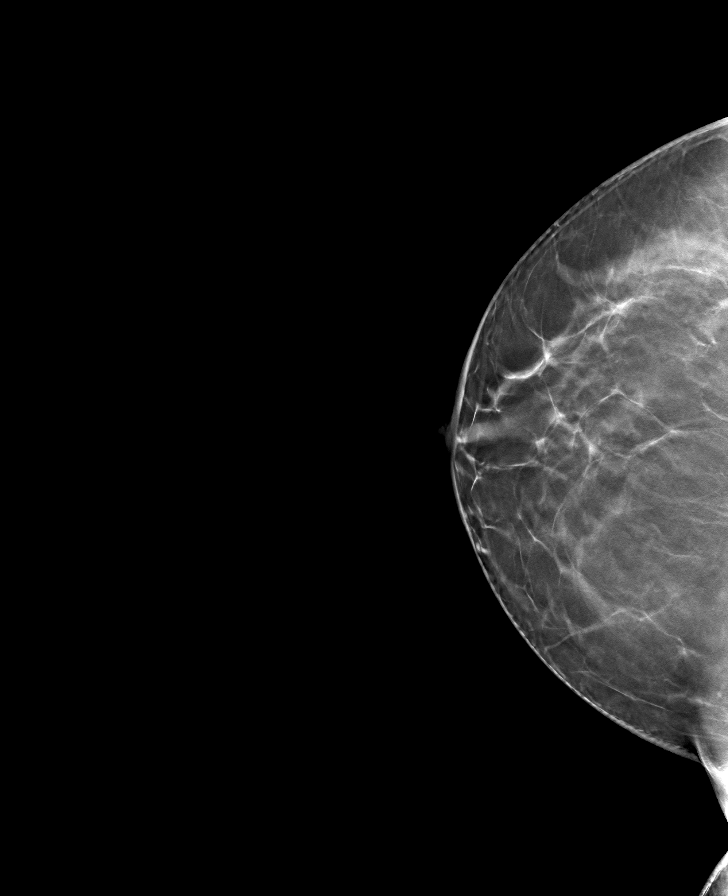

[L MLO tomo · tomo slice 44/87.0]
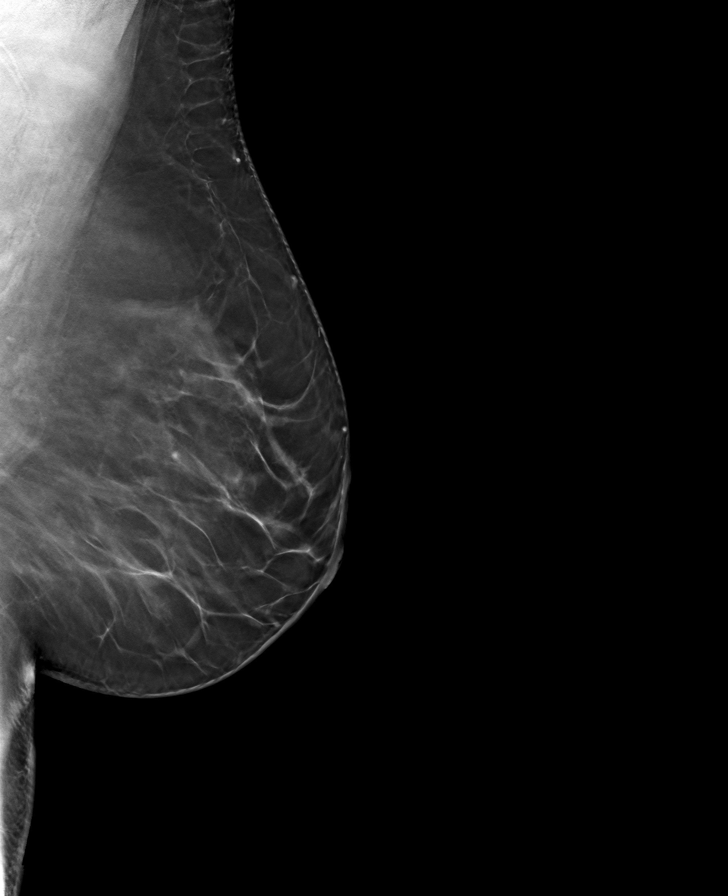

[L CC tomo · tomo slice 43/84.0]
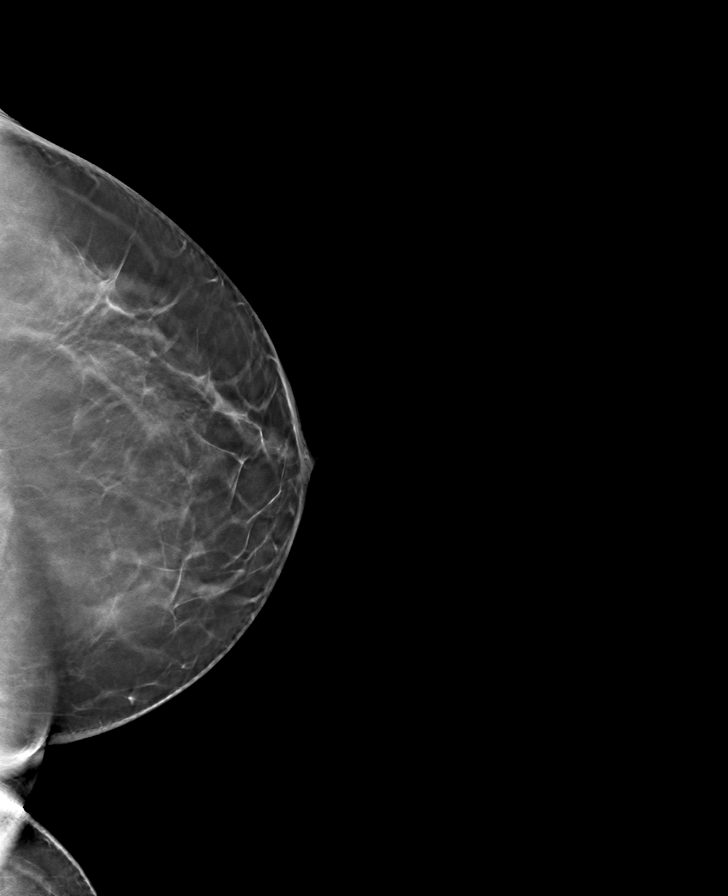

[8 of 24 positions shown; findings below may reference images not displayed]

ACR Breast Density Category b: There are scattered areas of
fibroglandular density.
FINDINGS: There are no findings suspicious for malignancy. Images were
processed with CAD.
IMPRESSION: No mammographic evidence of malignancy. A result letter of this
screening mammogram will be mailed directly to the patient.

RECOMMENDATION:
Screening mammogram in one year. (Code:[id number]-775)

BI-RADS CATEGORY  1: Negative.

## 2020-09-19 ENCOUNTER — Ambulatory Visit (INDEPENDENT_AMBULATORY_CARE_PROVIDER_SITE_OTHER): Payer: BC Managed Care – PPO | Admitting: Obstetrics and Gynecology

## 2020-09-19 ENCOUNTER — Other Ambulatory Visit: Payer: Self-pay

## 2020-09-19 ENCOUNTER — Encounter: Payer: Self-pay | Admitting: Obstetrics and Gynecology

## 2020-09-19 VITALS — BP 122/82 | HR 65 | Ht 65.0 in | Wt 218.0 lb

## 2020-09-19 DIAGNOSIS — Z01419 Encounter for gynecological examination (general) (routine) without abnormal findings: Secondary | ICD-10-CM | POA: Diagnosis not present

## 2020-09-19 DIAGNOSIS — B354 Tinea corporis: Secondary | ICD-10-CM | POA: Diagnosis not present

## 2020-09-19 MED ORDER — CLOTRIMAZOLE 1 % EX CREA: 1.0000 "application " | TOPICAL_CREAM | Freq: Two times a day (BID) | CUTANEOUS | 1 refills | Status: DC

## 2020-09-19 NOTE — Progress Notes (Signed)
60 y.o. G43P0003 Married Caucasian female here for annual exam.    Mom passed 04/2020. She had a stroke and then sepsis.  PCP:  Simone Curia, MD   Patient's last menstrual period was 09/20/2011 (exact date).           Sexually active: Yes.    The current method of family planning is vasectomy.    Exercising: Yes.     walking Smoker:  no  Health Maintenance: Pap: 01-06-18 Neg:Neg HR HPV, 05-02-12 Neg:Neg HR HPV, 09-30-09 Neg History of abnormal Pap:  no MMG: 11-11-19 3D/Neg/BiRads1 Colonoscopy: 04/2016 polyps;next due 04/2021(FH colon cancer in twin sister) BMD: 2002  Result :Normal TDaP: PCP Gardasil:   no HIV:02-11-15 NR Hep C:02-11-15 Neg Screening Labs:  PCP and cardiology.   reports that she has never smoked. She has never used smokeless tobacco. She reports current alcohol use of about 2.0 standard drinks per week. She reports that she does not use drugs.  Past Medical History:  Diagnosis Date   Anxiety    Hypertension     History reviewed. No pertinent surgical history.  Current Outpatient Medications  Medication Sig Dispense Refill   citalopram (CELEXA) 10 MG tablet 1 tablet     Ibuprofen 200 MG CAPS Take 2 capsules by mouth as needed (pain).      lisinopril-hydrochlorothiazide (ZESTORETIC) 20-25 MG tablet 1 tablet     Multiple Vitamins-Minerals (MULTI ADULT GUMMIES) CHEW 1 Gummie     rosuvastatin (CRESTOR) 5 MG tablet Take 1 tablet (5 mg total) by mouth daily. Please keep upcoming appt in July 2022 with Dr. Acie Fredrickson before anymore refills. Thank you 90 tablet 0   No current facility-administered medications for this visit.    Family History  Problem Relation Age of Onset   Osteoarthritis Mother    Hypertension Mother    Thyroid disease Mother    Hypertension Father    Bone cancer Father        deceased   Cancer - Colon Sister 49       twin sister   Heart attack Brother     Review of Systems  Musculoskeletal:        Occ. Swelling left ankle  All other  systems reviewed and are negative.  Exam:   BP 122/82   Pulse 65   Ht '5\' 5"'$  (1.651 m)   Wt 218 lb (98.9 kg)   LMP 09/20/2011 (Exact Date)   SpO2 99%   BMI 36.28 kg/m     General appearance: alert, cooperative and appears stated age Head: normocephalic, without obvious abnormality, atraumatic Neck: no adenopathy, supple, symmetrical, trachea midline and thyroid normal to inspection and palpation Lungs: clear to auscultation bilaterally Breasts: normal appearance, no masses or tenderness, No nipple retraction or dimpling, No nipple discharge or bleeding, No axillary adenopathy Heart: regular rate and rhythm Abdomen: soft, non-tender; no masses, no organomegaly Extremities: extremities normal, atraumatic, no cyanosis or edema Skin: skin color, texture, turgor normal.  Lymph nodes: cervical, supraclavicular, and axillary nodes normal. Neurologic: grossly normal  Pelvic: External genitalia: patches of flat erythema.               No abnormal inguinal nodes palpated.              Urethra:  normal appearing urethra with no masses, tenderness or lesions              Bartholins and Skenes: normal  Vagina: normal appearing vagina with normal color and discharge, no lesions              Cervix: no lesions              Pap taken: no Bimanual Exam:  Uterus:  normal size, contour, position, consistency, mobility, non-tender              Adnexa: no mass, fullness, tenderness              Rectal exam: yes.  Confirms.              Anus:  normal sphincter tone, no lesions  Chaperone was present for exam:  Estill Bamberg, CMA.  Assessment:   Well woman visit with gynecologic exam. FH colon cancer.  Tinea corpora.  Bereavement.   Plan: Mammogram screening discussed. Self breast awareness reviewed. Pap and HR HPV as above. Guidelines for Calcium, Vitamin D, regular exercise program including cardiovascular and weight bearing exercise. Colonoscopy next year. Clotrimazole cream.   Support given for loss of her mother.  Hospice bereavement counseling mentioned. Follow up annually and prn.   After visit summary provided.

## 2020-09-19 NOTE — Patient Instructions (Signed)

## 2020-10-02 NOTE — Progress Notes (Signed)
Cardiology Office Note:    Date:  10/03/2020   ID:  Michele Greer, DOB Jan 28, 1961, MRN [id number]  PCP:  Glenis Smoker, MD  Cardiologist:  Aivan Fillingim  Electrophysiologist:  None   Referring MD: Glenis Smoker, *   Chief Complaint  Patient presents with   Hyperlipidemia     Problem List 1. Essential HTN 2  Anxiety   History of Present Illness:    Michele Greer is a 60 y.o. female with a hx of HTN   She is here to discuss cardiac risk factors. Father had 2 MI Mother has  AVR Brother had an MI   No CP,   Walks regularly ,   3 days a week .   No DOE  No syncope Some shortness of breath climbing to top of hanging rock   August 07, 2019: Michele Greer  is seen today for follow-up visit.  She has a history of coronary calcifications and hyperlipidemia. Coronary calcium score of 5. This was 4 rd percentile for age and sex matched control.  Based on her presence of mild coronary artery disease we started her on rosuvastatin 5 mg a day. Last LDL particle number was 1236.  Her LDL is 80.  She is scheduled for repeat lipid levels this visit.  Tried taking zetia with the rosuvastatin but developed some MSK discomfort.  Getting some exercise  Could do more .   Diet has been ok.  Not as good for the past several weeks.   Aug. 29, 2022: Michele Greer is seen today for follow up of her coronary artery calcification and HLD  No CP or dyspnea  Has some isolated left ankel swelling at the end of the day .   Is getting some walking in . Walks 3 days a week.   No CP , no dyspnea  Is tolerating the rosuvastatin well Did not tolerate the addition of zetia   Past Medical History:  Diagnosis Date   Anxiety    Hypertension     No past surgical history on file.  Current Medications: Current Meds  Medication Sig   citalopram (CELEXA) 10 MG tablet 1 tablet   clotrimazole (CLOTRIMAZOLE AF) 1 % cream Apply 1 application topically 2 (two) times daily. Use for 2 weeks as needed.    Ibuprofen 200 MG CAPS Take 2 capsules by mouth as needed (pain).    lisinopril-hydrochlorothiazide (ZESTORETIC) 20-25 MG tablet 1 tablet   Multiple Vitamins-Minerals (MULTI ADULT GUMMIES) CHEW 1 Gummie   rosuvastatin (CRESTOR) 5 MG tablet Take 1 tablet (5 mg total) by mouth daily. Please keep upcoming appt in July 2022 with Dr. Acie Fredrickson before anymore refills. Thank you     Allergies:   Patient has no known allergies.   Social History   Socioeconomic History   Marital status: Married    Spouse name: Not on file   Number of children: Not on file   Years of education: Not on file   Highest education level: Not on file  Occupational History   Not on file  Tobacco Use   Smoking status: Never   Smokeless tobacco: Never  Vaping Use   Vaping Use: Never used  Substance and Sexual Activity   Alcohol use: Yes    Alcohol/week: 2.0 standard drinks    Types: 2 Glasses of wine per week   Drug use: No   Sexual activity: Yes    Birth control/protection: Other-see comments    Comment: vasectomy  Other Topics Concern   Not  on file  Social History Narrative   Not on file   Social Determinants of Health   Financial Resource Strain: Not on file  Food Insecurity: Not on file  Transportation Needs: Not on file  Physical Activity: Not on file  Stress: Not on file  Social Connections: Not on file     Family History: The patient's family history includes Bone cancer in her father; Cancer - Colon (age of onset: 40) in her sister; Heart attack in her brother; Hypertension in her father and mother; Osteoarthritis in her mother; Thyroid disease in her mother.  ROS:   Please see the history of present illness.     All other systems reviewed and are negative.  EKGs/Labs/Other Studies Reviewed:    The following studies were reviewed today:    Recent Labs: 11/18/2019: ALT 27  Recent Lipid Panel    Component Value Date/Time   CHOL 140 10/01/2018 0806   TRIG 50 10/01/2018 0806   HDL 53  10/01/2018 0806   CHOLHDL 2.6 10/01/2018 0806   LDLCALC 77 10/01/2018 0806    Physical Exam:    Physical Exam: Blood pressure 128/80, pulse 63, height '5\' 7"'$  (1.702 m), weight 216 lb 3.2 oz (98.1 kg), last menstrual period 09/20/2011, SpO2 97 %.  GEN:  Well nourished, well developed in no acute distress HEENT: Normal NECK: No JVD; No carotid bruits LYMPHATICS: No lymphadenopathy CARDIAC: RRR , no murmurs, rubs, gallops RESPIRATORY:  Clear to auscultation without rales, wheezing or rhonchi  ABDOMEN: Soft, non-tender, non-distended MUSCULOSKELETAL:  No edema; No deformity  SKIN: Warm and dry NEUROLOGIC:  Alert and oriented x 3   EKG:     October 03, 2020: Normal sinus rhythm at 63.  Nonspecific ST and T wave changes.  No changes from previous ecg    ASSESSMENT:    1. Elevated coronary artery calcium score   2. Mixed hyperlipidemia   3. Family history of early CAD   44. Essential hypertension     PLAN:    In order of problems listed above:  1.  Hyperlipidemia:   With a  Strong Family history of cardiac disease:   Is tolerating rosuvastatin 5 mg a day .   Did not tolerate the zetia .  Cont diet and weight loss efforts.    2.  Essential hypertension: . BP is well controlled.   Cont meds.    Medication Adjustments/Labs and Tests Ordered: Current medicines are reviewed at length with the patient today.  Concerns regarding medicines are outlined above.  Orders Placed This Encounter  Procedures   ALT   Lipid Profile   Basic Metabolic Panel (BMET)   EKG 12-Lead    No orders of the defined types were placed in this encounter.   Patient Instructions  Medication Instructions:  Your physician recommends that you continue on your current medications as directed. Please refer to the Current Medication list given to you today.  *If you need a refill on your cardiac medications before your next appointment, please call your pharmacy*   Lab Work: TODAY - cholesterol,  ALT, BMET If you have labs (blood work) drawn today and your tests are completely normal, you will receive your results only by: [place] (if you have MyChart) OR A paper copy in the mail If you have any lab test that is abnormal or we need to change your treatment, we will call you to review the results.   Testing/Procedures: None Ordered   Follow-Up: At [facility],  you and your health needs are our priority.  As part of our continuing mission to provide you with exceptional heart care, we have created designated Provider Care Teams.  These Care Teams include your primary Cardiologist (physician) and Advanced Practice Providers (APPs -  Physician Assistants and Nurse Practitioners) who all work together to provide you with the care you need, when you need it.    Your next appointment:   1 year(s)  The format for your next appointment:   In Person  Provider:   You may see Mertie Moores, MD or one of the following Advanced Practice Providers on your designated Care Team:   Richardson Dopp, PA-C Robbie Lis, [place]    Signed, Mertie Moores, MD  10/03/2020 9:25 AM    [place]

## 2020-10-03 ENCOUNTER — Encounter: Payer: Self-pay | Admitting: Cardiovascular Disease

## 2020-10-03 ENCOUNTER — Ambulatory Visit: Payer: Self-pay | Admitting: Cardiovascular Disease

## 2020-10-03 ENCOUNTER — Other Ambulatory Visit: Payer: Self-pay

## 2020-10-03 VITALS — BP 128/80 | HR 63 | Ht 67.0 in | Wt 216.2 lb

## 2020-10-03 DIAGNOSIS — I10 Essential (primary) hypertension: Secondary | ICD-10-CM

## 2020-10-03 DIAGNOSIS — Z8249 Family history of ischemic heart disease and other diseases of the circulatory system: Secondary | ICD-10-CM

## 2020-10-03 DIAGNOSIS — R931 Abnormal findings on diagnostic imaging of heart and coronary circulation: Secondary | ICD-10-CM

## 2020-10-03 DIAGNOSIS — E782 Mixed hyperlipidemia: Secondary | ICD-10-CM

## 2020-10-03 LAB — BASIC METABOLIC PANEL
BUN/Creatinine Ratio: 18 (ref 12–28)
BUN: 14 mg/dL (ref 8–27)
CO2: 27 mmol/L (ref 20–29)
Calcium: 9.8 mg/dL (ref 8.7–10.3)
Chloride: 99 mmol/L (ref 96–106)
Creatinine, Ser: 0.76 mg/dL (ref 0.57–1.00)
Glucose: 108 mg/dL — ABNORMAL HIGH (ref 65–99)
Potassium: 4 mmol/L (ref 3.5–5.2)
Sodium: 139 mmol/L (ref 134–144)
eGFR: 89 mL/min/{1.73_m2} (ref >59)

## 2020-10-03 LAB — LIPID PANEL
Chol/HDL Ratio: 2.5 ratio (ref 0.0–4.4)
Cholesterol, Total: 129 mg/dL (ref 100–199)
HDL: 51 mg/dL (ref >39)
LDL Chol Calc (NIH): 63 mg/dL (ref 0–99)
Triglycerides: 73 mg/dL (ref 0–149)
VLDL Cholesterol Cal: 15 mg/dL (ref 5–40)

## 2020-10-03 LAB — ALT: ALT: 30 IU/L (ref 0–32)

## 2020-10-03 NOTE — Patient Instructions (Signed)
Medication Instructions:  Your physician recommends that you continue on your current medications as directed. Please refer to the Current Medication list given to you today.  *If you need a refill on your cardiac medications before your next appointment, please call your pharmacy*   Lab Work: TODAY - cholesterol, ALT, BMET If you have labs (blood work) drawn today and your tests are completely normal, you will receive your results only by: [place] (if you have MyChart) OR A paper copy in the mail If you have any lab test that is abnormal or we need to change your treatment, we will call you to review the results.   Testing/Procedures: None Ordered   Follow-Up: At [facility], you and your health needs are our priority.  As part of our continuing mission to provide you with exceptional heart care, we have created designated Provider Care Teams.  These Care Teams include your primary Cardiologist (physician) and Advanced Practice Providers (APPs -  Physician Assistants and Nurse Practitioners) who all work together to provide you with the care you need, when you need it.    Your next appointment:   1 year(s)  The format for your next appointment:   In Person  Provider:   You may see Mertie Moores, MD or one of the following Advanced Practice Providers on your designated Care Team:   Richardson Dopp, PA-C [place], [place]

## 2020-10-17 ENCOUNTER — Other Ambulatory Visit: Payer: Self-pay | Admitting: Family Medicine

## 2020-10-17 DIAGNOSIS — Z1231 Encounter for screening mammogram for malignant neoplasm of breast: Secondary | ICD-10-CM

## 2020-11-18 ENCOUNTER — Ambulatory Visit
Admission: RE | Admit: 2020-11-18 | Discharge: 2020-11-18 | Disposition: A | Discharge status: Home / Self Care | Payer: BC Managed Care – PPO | Source: Ambulatory Visit | Attending: Family Medicine | Admitting: Family Medicine

## 2020-11-18 ENCOUNTER — Other Ambulatory Visit: Payer: Self-pay

## 2020-11-18 DIAGNOSIS — Z1231 Encounter for screening mammogram for malignant neoplasm of breast: Secondary | ICD-10-CM

## 2021-01-26 ENCOUNTER — Other Ambulatory Visit: Payer: Self-pay | Admitting: Cardiovascular Disease

## 2021-04-18 NOTE — Progress Notes (Signed)
? ?Office Visit  ?  ?Patient Name: Michele Greer ?Date of Encounter: 04/19/2021 ? ?PCP:  Glenis Smoker, MD ?  ?[place]  ?Cardiologist:  Mertie Moores, MD  ?Advanced Practice Provider:  No care team member to display ?Electrophysiologist:  None  ? ? ?HPI  ?  ?Michele Greer is a 61 y.o. female with a hx of CAD, hyperlipidemia,  hypertension presents today for follow-up appointment.  ? ?She was last seen in the office October 03, 2020 for follow-up of her coronary artery disease and hyperlipidemia.  At that time she did not have any chest pain or dyspnea on exertion.  She has had some isolated left ankle swelling at the end of the day.  She is getting some walking in 3 days a week.  ? ?Today, she feels good today without any cardiovascular complaints.  She has had a 30 pound weight loss.  She is joined an exercise class and has been watching her diet regularly.  She denies any chest pain, shortness of breath, or palpitations.  She has a strong family history of early CAD and had a genetic work-up done in 2021.  Overall, she is feeling well today without any complaints. ? ?Reports no shortness of breath nor dyspnea on exertion. Reports no chest pain, pressure, or tightness. No edema, orthopnea, PND. Reports no palpitations.  ? ? ?Past Medical History  ?  ?Past Medical History:  ?Diagnosis Date  ? Anxiety   ? Hypertension   ? ?History reviewed. No pertinent surgical history. ? ?Allergies ? ?No Known Allergies ? ? ?EKGs/Labs/Other Studies Reviewed:  ? ?The following studies were reviewed today: ? ?02/12/2018 Cardiac CT ? ?FINDINGS: ?Non-cardiac: No significant non cardiac findings on limited lung and ?soft tissue windows. See separate report from [facility]. ?  ?Ascending Aorta: Normal diameter 3.1 cm ?  ?Pericardium: Normal ?  ?Coronary arteries: Mild calcium noted in proximal LAD ?  ?IMPRESSION: ?Coronary calcium score of 5. This was 72 rd percentile for age and ?sex matched  control. ?  ?Jenkins Rouge ?  ?Electronically Signed: ?By: Jenkins Rouge M.D. ? ?EKG:  EKG is not ordered today.   ? ?Recent Labs: ?10/03/2020: ALT 30; BUN 14; Creatinine, Ser 0.76; Potassium 4.0; Sodium 139  ?Recent Lipid Panel ?   ?Component Value Date/Time  ? CHOL 129 10/03/2020 0927  ? TRIG 73 10/03/2020 0927  ? HDL 51 10/03/2020 0927  ? CHOLHDL 2.5 10/03/2020 0927  ? Anthem 63 10/03/2020 0927  ? ? ?Home Medications  ? ?Current Meds  ?Medication Sig  ? citalopram (CELEXA) 10 MG tablet 1 tablet  ? clotrimazole (CLOTRIMAZOLE AF) 1 % cream Apply 1 application topically 2 (two) times daily. Use for 2 weeks as needed.  ? Ibuprofen 200 MG CAPS Take 2 capsules by mouth as needed (pain).   ? Multiple Vitamin (MULTIVITAMIN) tablet Take 1 tablet by mouth daily. Women's 50 plus vitamin  ? [DISCONTINUED] lisinopril-hydrochlorothiazide (ZESTORETIC) 20-25 MG tablet daily.  ? [DISCONTINUED] rosuvastatin (CRESTOR) 5 MG tablet TAKE 1 TABLET DAILY. PLEASE KEEP JULY APPT FOR FURTHER REFILLS.  ?  ? ?Review of Systems  ?    ?All other systems reviewed and are otherwise negative except as noted above. ? ?Physical Exam  ?  ?VS:  BP 124/84   Pulse 60   Ht 5' 7.5" (1.715 m)   Wt 185 lb 3.2 oz (84 kg)   LMP 09/20/2011 (Exact Date)   SpO2 98%   BMI 28.58 kg/m?  ,  BMI Body mass index is 28.58 kg/m?. ? ?Wt Readings from Last 3 Encounters:  ?04/19/21 185 lb 3.2 oz (84 kg)  ?10/03/20 216 lb 3.2 oz (98.1 kg)  ?09/19/20 218 lb (98.9 kg)  ?  ? ?GEN: Well nourished, well developed, in no acute distress. ?HEENT: normal. ?Neck: Supple, no JVD, carotid bruits, or masses. ?Cardiac: RRR, no murmurs, rubs, or gallops. No clubbing, cyanosis, edema.  Radials/PT 2+ and equal bilaterally.  ?Respiratory:  Respirations regular and unlabored, clear to auscultation bilaterally. ?GI: Soft, nontender, nondistended. ?MS: No deformity or atrophy. ?Skin: Warm and dry, no rash. ?Neuro:  Strength and sensation are intact. ?Psych: Normal affect. ? ?Assessment &  Plan  ?  ?Coronary artery disease ?-CTA back in 02/2018 which showed calcium score of 5. Mild calcium in the proximal LAD ?-Continue lisinopril/HCTZ 20-25 mg daily and Crestor 5 mg daily ?-Lipid panel and LFTs today ? ?Mixed hyperlipidemia ?-continue Crestor '5mg'$  daily ?-Lipid panel and LFTs today ? ?Family history of early CAD ?-father, late 11s years old MI ?-brother, 52 years old MI ?-mother also has cardiac issues ?-genetic work-up back in 2021 ? ?Essential hypertension ?-Well controlled today ?-Continue current medication regimen ? ?Disposition: Follow up 1 year with Mertie Moores, MD or APP. ? ?Signed, ?Elgie Collard, PA-C ?04/19/2021, 10:21 AM ?[place]  ?

## 2021-04-19 ENCOUNTER — Other Ambulatory Visit: Payer: Self-pay

## 2021-04-19 ENCOUNTER — Encounter: Payer: Self-pay | Admitting: Physician Assistant

## 2021-04-19 ENCOUNTER — Ambulatory Visit: Payer: BC Managed Care – PPO | Admitting: Physician Assistant

## 2021-04-19 VITALS — BP 124/84 | HR 60 | Ht 67.5 in | Wt 185.2 lb

## 2021-04-19 DIAGNOSIS — Z79899 Other long term (current) drug therapy: Secondary | ICD-10-CM

## 2021-04-19 DIAGNOSIS — I10 Essential (primary) hypertension: Secondary | ICD-10-CM

## 2021-04-19 DIAGNOSIS — I2510 Atherosclerotic heart disease of native coronary artery without angina pectoris: Secondary | ICD-10-CM

## 2021-04-19 DIAGNOSIS — E782 Mixed hyperlipidemia: Secondary | ICD-10-CM

## 2021-04-19 LAB — LIPID PANEL
Chol/HDL Ratio: 2.4 ratio (ref 0.0–4.4)
Cholesterol, Total: 124 mg/dL (ref 100–199)
HDL: 52 mg/dL (ref >39)
LDL Chol Calc (NIH): 59 mg/dL (ref 0–99)
Triglycerides: 61 mg/dL (ref 0–149)
VLDL Cholesterol Cal: 13 mg/dL (ref 5–40)

## 2021-04-19 LAB — HEPATIC FUNCTION PANEL
ALT: 26 IU/L (ref 0–32)
AST: 23 IU/L (ref 0–40)
Albumin: 4.8 g/dL (ref 3.8–4.8)
Alkaline Phosphatase: 71 IU/L (ref 44–121)
Bilirubin Total: 0.5 mg/dL (ref 0.0–1.2)
Bilirubin, Direct: 0.15 mg/dL (ref 0.00–0.40)
Total Protein: 6.5 g/dL (ref 6.0–8.5)

## 2021-04-19 MED ORDER — LISINOPRIL-HYDROCHLOROTHIAZIDE 20-25 MG PO TABS
1.0000 | ORAL_TABLET | Freq: Every day | ORAL | 3 refills | Status: AC
Start: 2021-04-19 — End: ?

## 2021-04-19 MED ORDER — ROSUVASTATIN CALCIUM 5 MG PO TABS: ORAL_TABLET | ORAL | 3 refills | Status: DC

## 2021-04-19 NOTE — Patient Instructions (Signed)
Medication Instructions:  ?Your physician recommends that you continue on your current medications as directed. Please refer to the Current Medication list given to you today.  ?*If you need a refill on your cardiac medications before your next appointment, please call your pharmacy* ? ? ?Lab Work: ?Lipids and lft's ?If you have labs (blood work) drawn today and your tests are completely normal, you will receive your results only by: ?MyChart Message (if you have MyChart) OR ?A paper copy in the mail ?If you have any lab test that is abnormal or we need to change your treatment, we will call you to review the results. ? ?Follow-Up: ?At [facility], you and your health needs are our priority.  As part of our continuing mission to provide you with exceptional heart care, we have created designated Provider Care Teams.  These Care Teams include your primary Cardiologist (physician) and Advanced Practice Providers (APPs -  Physician Assistants and Nurse Practitioners) who all work together to provide you with the care you need, when you need it. ? ? ?Your next appointment:   ?1 year(s) ? ?The format for your next appointment:   ?In Person ? ?Provider:   ?Mertie Moores, MD { ?

## 2021-10-06 ENCOUNTER — Other Ambulatory Visit: Payer: Self-pay | Admitting: Family Medicine

## 2021-10-06 DIAGNOSIS — Z1231 Encounter for screening mammogram for malignant neoplasm of breast: Secondary | ICD-10-CM

## 2021-10-23 NOTE — Progress Notes (Unsigned)
61 y.o. [id number] Married Caucasian female here for annual exam.    PCP: Sela Hilding, MD    Patient's last menstrual period was 09/20/2011 (exact date).           Sexually active: {yes no:314532}  The current method of family planning is vasectomy.    Exercising: {yes no:314532}  {types:19826} Smoker:  no  Health Maintenance: Pap:  01-06-18 Neg:Neg HR HPV, 05-02-12 Neg:Neg HR HPV, 09-30-09 Neg History of abnormal Pap:  no MMG: 11-18-20 Neg/Birads1.  Appt. 11-22-21 Colonoscopy:  *** 04/2016 polyps;next due 04/2021(FH colon cancer in twin sister) BMD:  ***2002  Result :Normal TDaP:  PCP Gardasil:   no HIV: 02-11-15 NR Hep C: 02-11-15 Neg Screening Labs:  Hb today: ***, Urine today: ***   reports that she has never smoked. She has never used smokeless tobacco. She reports current alcohol use of about 2.0 standard drinks of alcohol per week. She reports that she does not use drugs.  Past Medical History:  Diagnosis Date   Anxiety    Hypertension     No past surgical history on file.  Current Outpatient Medications  Medication Sig Dispense Refill   citalopram (CELEXA) 10 MG tablet 1 tablet     clotrimazole (CLOTRIMAZOLE AF) 1 % cream Apply 1 application topically 2 (two) times daily. Use for 2 weeks as needed. 30 g 1   Ibuprofen 200 MG CAPS Take 2 capsules by mouth as needed (pain).      lisinopril-hydrochlorothiazide (ZESTORETIC) 20-25 MG tablet Take 1 tablet by mouth daily. 90 tablet 3   Multiple Vitamin (MULTIVITAMIN) tablet Take 1 tablet by mouth daily. Women's 50 plus vitamin     Multiple Vitamins-Minerals (MULTI ADULT GUMMIES) CHEW 1 Gummie (Patient not taking: Reported on 04/19/2021)     rosuvastatin (CRESTOR) 5 MG tablet TAKE 1 TABLET BY MOUTH DAILY. 90 tablet 3   No current facility-administered medications for this visit.    Family History  Problem Relation Age of Onset   Osteoarthritis Mother    Hypertension Mother    Thyroid disease Mother    Hypertension Father     Bone cancer Father        deceased   Cancer - Colon Sister 33       twin sister   Heart attack Brother     Review of Systems  Exam:   LMP 09/20/2011 (Exact Date)     General appearance: alert, cooperative and appears stated age Head: normocephalic, without obvious abnormality, atraumatic Neck: no adenopathy, supple, symmetrical, trachea midline and thyroid normal to inspection and palpation Lungs: clear to auscultation bilaterally Breasts: normal appearance, no masses or tenderness, No nipple retraction or dimpling, No nipple discharge or bleeding, No axillary adenopathy Heart: regular rate and rhythm Abdomen: soft, non-tender; no masses, no organomegaly Extremities: extremities normal, atraumatic, no cyanosis or edema Skin: skin color, texture, turgor normal. No rashes or lesions Lymph nodes: cervical, supraclavicular, and axillary nodes normal. Neurologic: grossly normal  Pelvic: External genitalia:  no lesions              No abnormal inguinal nodes palpated.              Urethra:  normal appearing urethra with no masses, tenderness or lesions              Bartholins and Skenes: normal                 Vagina: normal appearing vagina with normal color and discharge, no  lesions              Cervix: no lesions              Pap taken: {yes no:314532} Bimanual Exam:  Uterus:  normal size, contour, position, consistency, mobility, non-tender              Adnexa: no mass, fullness, tenderness              Rectal exam: {yes no:314532}.  Confirms.              Anus:  normal sphincter tone, no lesions  Chaperone was present for exam:  ***  Assessment:   Well woman visit with gynecologic exam.   Plan: Mammogram screening discussed. Self breast awareness reviewed. Pap and HR HPV as above. Guidelines for Calcium, Vitamin D, regular exercise program including cardiovascular and weight bearing exercise.   Follow up annually and prn.   Additional counseling given.  {yes  [id number]. _______ minutes face to face time of which over 50% was spent in counseling.    After visit summary provided.

## 2021-10-24 ENCOUNTER — Encounter: Payer: Self-pay | Admitting: Obstetrics and Gynecology

## 2021-10-24 ENCOUNTER — Ambulatory Visit (INDEPENDENT_AMBULATORY_CARE_PROVIDER_SITE_OTHER): Payer: BC Managed Care – PPO | Admitting: Obstetrics and Gynecology

## 2021-10-24 VITALS — BP 122/74 | HR 70 | Ht 65.0 in | Wt 192.0 lb

## 2021-10-24 DIAGNOSIS — Z01419 Encounter for gynecological examination (general) (routine) without abnormal findings: Secondary | ICD-10-CM

## 2021-10-24 NOTE — Patient Instructions (Signed)

## 2021-11-22 ENCOUNTER — Ambulatory Visit: Payer: BC Managed Care – PPO

## 2021-12-20 ENCOUNTER — Ambulatory Visit
Admission: RE | Admit: 2021-12-20 | Discharge: 2021-12-20 | Disposition: A | Discharge status: Home / Self Care | Payer: 59 | Source: Ambulatory Visit | Attending: Family Medicine | Admitting: Family Medicine

## 2021-12-20 DIAGNOSIS — Z1231 Encounter for screening mammogram for malignant neoplasm of breast: Secondary | ICD-10-CM

## 2022-04-26 ENCOUNTER — Encounter: Payer: Self-pay | Admitting: Cardiovascular Disease

## 2022-04-26 NOTE — Progress Notes (Signed)
Cardiology Office Note:    Date:  04/27/2022   ID:  Michele Greer, DOB 1960-02-16, MRN [id number]  PCP:  Glenis Smoker, MD  Cardiologist:  Labrian Torregrossa  Electrophysiologist:  None   Referring MD: Glenis Smoker, *   Chief Complaint  Patient presents with   coronary calcification     Problem List 1. Essential HTN 2  Anxiety   History of Present Illness:    Michele Greer is a 62 y.o. female with a hx of HTN   She is here to discuss cardiac risk factors. Father had 2 MI Mother has  AVR Brother had an MI   No CP,   Walks regularly ,   3 days a week .   No DOE  No syncope Some shortness of breath climbing to top of hanging rock   August 07, 2019: Michele Greer  is seen today for follow-up visit.  She has a history of coronary calcifications and hyperlipidemia. Coronary calcium score of 5. This was 61 rd percentile for age and sex matched control.  Based on her presence of mild coronary artery disease we started her on rosuvastatin 5 mg a day. Last LDL particle number was 1236.  Her LDL is 80.  She is scheduled for repeat lipid levels this visit.  Tried taking zetia with the rosuvastatin but developed some MSK discomfort.  Getting some exercise  Could do more .   Diet has been ok.  Not as good for the past several weeks.   Aug. 29, 2022: Michele Greer is seen today for follow up of her coronary artery calcification and HLD  No CP or dyspnea  Has some isolated left ankel swelling at the end of the day .   Is getting some walking in . Walks 3 days a week.   No CP , no dyspnea  Is tolerating the rosuvastatin well Did not tolerate the addition of zetia   April 27, 2022 Michele Greer is seen for follow up of her Coronary artery calcification and HLD   Is on rosuvastatin 5 several times a week  Develops muscle aches when she takes it daily  Coronary calcium score in Jan. 2020  of 5. This was 71 rd percentile for age and sex matched control.     Past Medical History:   Diagnosis Date   Anxiety    Hypertension     History reviewed. No pertinent surgical history.  Current Medications: Current Meds  Medication Sig   citalopram (CELEXA) 10 MG tablet 1 tablet   Ibuprofen 200 MG CAPS Take 2 capsules by mouth as needed (pain).    lisinopril-hydrochlorothiazide (ZESTORETIC) 20-25 MG tablet Take 1 tablet by mouth daily.   Multiple Vitamins-Minerals (MULTI ADULT GUMMIES) CHEW Chew 1 each by mouth daily. Chew 1 gummy daily   rosuvastatin (CRESTOR) 5 MG tablet TAKE 1 TABLET BY MOUTH DAILY.   [DISCONTINUED] clotrimazole (CLOTRIMAZOLE AF) 1 % cream Apply 1 application topically 2 (two) times daily. Use for 2 weeks as needed.   [DISCONTINUED] Multiple Vitamin (MULTIVITAMIN) tablet Take 1 tablet by mouth daily. Women's 50 plus vitamin     Allergies:   Patient has no known allergies.   Social History   Socioeconomic History   Marital status: Married    Spouse name: Not on file   Number of children: Not on file   Years of education: Not on file   Highest education level: Not on file  Occupational History   Not on file  Tobacco Use  Smoking status: Never   Smokeless tobacco: Never  Vaping Use   Vaping Use: Never used  Substance and Sexual Activity   Alcohol use: Yes    Alcohol/week: 2.0 standard drinks of alcohol    Types: 2 Glasses of wine per week   Drug use: No   Sexual activity: Yes    Birth control/protection: Other-see comments    Comment: vasectomy  Other Topics Concern   Not on file  Social History Narrative   Not on file   Social Determinants of Health   Financial Resource Strain: Not on file  Food Insecurity: Not on file  Transportation Needs: Not on file  Physical Activity: Not on file  Stress: Not on file  Social Connections: Not on file     Family History: The patient's family history includes Bone cancer in her father; Cancer - Colon (age of onset: 2) in her sister; Heart attack in her brother; Hypertension in her father  and mother; Osteoarthritis in her mother; Thyroid disease in her mother. There is no history of Breast cancer.  ROS:   Please see the history of present illness.     All other systems reviewed and are negative.  EKGs/Labs/Other Studies Reviewed:    The following studies were reviewed today:    Recent Labs: No results found for requested labs within last 365 days.  Recent Lipid Panel    Component Value Date/Time   CHOL 124 04/19/2021 1015   TRIG 61 04/19/2021 1015   HDL 52 04/19/2021 1015   CHOLHDL 2.4 04/19/2021 1015   LDLCALC 59 04/19/2021 1015    Physical Exam:    Physical Exam: Blood pressure 126/78, pulse (!) 58, height 5\' 7"  (1.702 m), weight 194 lb 6.4 oz (88.2 kg), last menstrual period 09/20/2011, SpO2 97 %.      GEN:  Well nourished, well developed in no acute distress HEENT: Normal NECK: No JVD; No carotid bruits LYMPHATICS: No lymphadenopathy CARDIAC: RRR , no murmurs, rubs, gallops RESPIRATORY:  Clear to auscultation without rales, wheezing or rhonchi  ABDOMEN: Soft, non-tender, non-distended MUSCULOSKELETAL:  No edema; No deformity  SKIN: Warm and dry NEUROLOGIC:  Alert and oriented x 3   EKG:      April 27, 2022: Sinus bradycardia at 58 beats a minute.  Otherwise normal EKG.   ASSESSMENT:    1. Mixed hyperlipidemia   2. Coronary artery calcification      PLAN:       1.  Hyperlipidemia:    she tolerates the rosuvastatin 5 mg 3-4 days a week.   Her last LDL was 53.   Will check lipids today    2.  Essential hypertension: .  BP is well controlled.   Will see her in a year    Medication Adjustments/Labs and Tests Ordered: Current medicines are reviewed at length with the patient today.  Concerns regarding medicines are outlined above.  Orders Placed This Encounter  Procedures   Lipid panel   Lipoprotein A (LPA)   ALT   Basic metabolic panel   EKG [id number]    No orders of the defined types were placed in this  encounter.    Patient Instructions  Medication Instructions:  Your physician recommends that you continue on your current medications as directed. Please refer to the Current Medication list given to you today. *If you need a refill on your cardiac medications before your next appointment, please call your pharmacy*   Lab Work: Lipids, Lp(a), ALT, and BMET TODAY  If you have labs (blood work) drawn today and your tests are completely normal, you will receive your results only by: [place] (if you have MyChart) OR A paper copy in the mail If you have any lab test that is abnormal or we need to change your treatment, we will call you to review the results.   Follow-Up: At [facility], you and your health needs are our priority.  As part of our continuing mission to provide you with exceptional heart care, we have created designated Provider Care Teams.  These Care Teams include your primary Cardiologist (physician) and Advanced Practice Providers (APPs -  Physician Assistants and Nurse Practitioners) who all work together to provide you with the care you need, when you need it.  Your next appointment:   1 year(s)  Provider:   Mertie Moores, MD     Signed, Mertie Moores, MD  04/27/2022 11:58 AM    [place]

## 2022-04-27 ENCOUNTER — Encounter: Payer: Self-pay | Admitting: Cardiovascular Disease

## 2022-04-27 ENCOUNTER — Ambulatory Visit: Payer: 59 | Attending: Cardiovascular Disease | Admitting: Cardiovascular Disease

## 2022-04-27 VITALS — BP 126/78 | HR 58 | Ht 67.0 in | Wt 194.4 lb

## 2022-04-27 DIAGNOSIS — I2510 Atherosclerotic heart disease of native coronary artery without angina pectoris: Secondary | ICD-10-CM | POA: Diagnosis not present

## 2022-04-27 DIAGNOSIS — I2584 Coronary atherosclerosis due to calcified coronary lesion: Secondary | ICD-10-CM

## 2022-04-27 DIAGNOSIS — E782 Mixed hyperlipidemia: Secondary | ICD-10-CM

## 2022-04-27 NOTE — Patient Instructions (Signed)
Medication Instructions:  Your physician recommends that you continue on your current medications as directed. Please refer to the Current Medication list given to you today. *If you need a refill on your cardiac medications before your next appointment, please call your pharmacy*   Lab Work: Lipids, Lp(a), ALT, and BMET TODAY If you have labs (blood work) drawn today and your tests are completely normal, you will receive your results only by: [place] (if you have MyChart) OR A paper copy in the mail If you have any lab test that is abnormal or we need to change your treatment, we will call you to review the results.   Follow-Up: At [facility], you and your health needs are our priority.  As part of our continuing mission to provide you with exceptional heart care, we have created designated Provider Care Teams.  These Care Teams include your primary Cardiologist (physician) and Advanced Practice Providers (APPs -  Physician Assistants and Nurse Practitioners) who all work together to provide you with the care you need, when you need it.  Your next appointment:   1 year(s)  Provider:   Mertie Moores, MD

## 2022-04-29 LAB — BASIC METABOLIC PANEL
BUN/Creatinine Ratio: 20 (ref 12–28)
BUN: 14 mg/dL (ref 8–27)
CO2: 27 mmol/L (ref 20–29)
Calcium: 9.9 mg/dL (ref 8.7–10.3)
Chloride: 101 mmol/L (ref 96–106)
Creatinine, Ser: 0.71 mg/dL (ref 0.57–1.00)
Glucose: 86 mg/dL (ref 70–99)
Potassium: 4.1 mmol/L (ref 3.5–5.2)
Sodium: 141 mmol/L (ref 134–144)
eGFR: 95 mL/min/{1.73_m2} (ref >59)

## 2022-04-29 LAB — LIPID PANEL
Chol/HDL Ratio: 2.4 ratio (ref 0.0–4.4)
Cholesterol, Total: 153 mg/dL (ref 100–199)
HDL: 65 mg/dL (ref >39)
LDL Chol Calc (NIH): 77 mg/dL (ref 0–99)
Triglycerides: 55 mg/dL (ref 0–149)
VLDL Cholesterol Cal: 11 mg/dL (ref 5–40)

## 2022-04-29 LAB — LIPOPROTEIN A (LPA): Lipoprotein (a): 28.4 nmol/L (ref <75.0)

## 2022-04-29 LAB — ALT: ALT: 20 IU/L (ref 0–32)

## 2022-11-14 ENCOUNTER — Other Ambulatory Visit: Payer: Self-pay | Admitting: Family Medicine

## 2022-11-14 DIAGNOSIS — Z0000 Encounter for general adult medical examination without abnormal findings: Secondary | ICD-10-CM

## 2022-12-26 ENCOUNTER — Ambulatory Visit
Admission: RE | Admit: 2022-12-26 | Discharge: 2022-12-26 | Disposition: A | Discharge status: Home / Self Care | Payer: 59 | Source: Ambulatory Visit | Attending: Family Medicine | Admitting: Family Medicine

## 2022-12-26 DIAGNOSIS — Z0000 Encounter for general adult medical examination without abnormal findings: Secondary | ICD-10-CM

## 2023-04-15 ENCOUNTER — Encounter: Payer: Self-pay | Admitting: Cardiovascular Disease

## 2023-04-18 ENCOUNTER — Encounter: Payer: Self-pay | Admitting: Cardiovascular Disease

## 2023-04-18 NOTE — Progress Notes (Unsigned)
 Cardiology Office Note:    Date:  04/19/2023   ID:  Michele Greer, DOB 06-28-1960, MRN [id number]  PCP:  Shon Hale, MD  Cardiologist:  Aveyah Greenwood  Electrophysiologist:  None   Referring MD: Shon Hale, *   Chief Complaint  Patient presents with   Hyperlipidemia   coronary calcification     Problem List 1. Essential HTN 2  Anxiety   History of Present Illness:    Michele Greer is a 63 y.o. female with a hx of HTN   She is here to discuss cardiac risk factors. Father had 2 MI Mother has  AVR Brother had an MI   No CP,   Walks regularly ,   3 days a week .   No DOE  No syncope Some shortness of breath climbing to top of hanging [place]   August 07, 2019: Michele Greer  is seen today for follow-up visit.  She has a history of coronary calcifications and hyperlipidemia. Coronary calcium score of 5. This was 16 rd percentile for age and sex matched control.  Based on her presence of mild coronary artery disease we started her on rosuvastatin 5 mg a day. Last LDL particle number was 1236.  Her LDL is 80.  She is scheduled for repeat lipid levels this visit.  Tried taking zetia with the rosuvastatin but developed some MSK discomfort.  Getting some exercise  Could do more .   Diet has been ok.  Not as good for the past several weeks.   Aug. 29, 2022: Michele Greer is seen today for follow up of her coronary artery calcification and HLD  No CP or dyspnea  Has some isolated left ankel swelling at the end of the day .   Is getting some walking in . Walks 3 days a week.   No CP , no dyspnea  Is tolerating the rosuvastatin well Did not tolerate the addition of zetia   April 27, 2022 Michele Greer is seen for follow up of her Coronary artery calcification and HLD   Is on rosuvastatin 5 several times a week  Develops muscle aches when she takes it daily  Coronary calcium score in Jan. 2020  of 5. This was 60 rd percentile for age and sex matched control.    April 19, 2023 Michele Greer is seen for follow up of her coronary artery calcifications and HLD   Her oldest brother had an MI and passes away recently at age 58.  Has not been consistent with the rosuvastatin because of muscle aches   Her LP(a) is   28.4  ( normal )    She does not get much exercise  Wt is 200 lbs  She knows that she needs to exercise - even in the winter        Past Medical History:  Diagnosis Date   Anxiety    Hypertension     History reviewed. No pertinent surgical history.  Current Medications: Current Meds  Medication Sig   citalopram (CELEXA) 10 MG tablet 1 tablet   Ibuprofen 200 MG CAPS Take 2 capsules by mouth as needed (pain).    lisinopril-hydrochlorothiazide (ZESTORETIC) 20-25 MG tablet Take 1 tablet by mouth daily.   Multiple Vitamins-Minerals (MULTI ADULT GUMMIES) CHEW Chew 1 each by mouth daily. Chew 1 gummy daily   rosuvastatin (CRESTOR) 5 MG tablet TAKE 1 TABLET BY MOUTH DAILY.     Allergies:   Patient has no known allergies.   Social History   Socioeconomic  History   Marital status: Married    Spouse name: Not on file   Number of children: Not on file   Years of education: Not on file   Highest education level: Not on file  Occupational History   Not on file  Tobacco Use   Smoking status: Never   Smokeless tobacco: Never  Vaping Use   Vaping status: Never Used  Substance and Sexual Activity   Alcohol use: Yes    Alcohol/week: 2.0 standard drinks of alcohol    Types: 2 Glasses of wine per week   Drug use: No   Sexual activity: Yes    Birth control/protection: Other-see comments    Comment: vasectomy  Other Topics Concern   Not on file  Social History Narrative   Not on file   Social Drivers of Health   Financial Resource Strain: Not on file  Food Insecurity: Not on file  Transportation Needs: Not on file  Physical Activity: Not on file  Stress: Not on file  Social Connections: Not on file     Family History: The patient's  family history includes Bone cancer in her father; Cancer - Colon (age of onset: 47) in her sister; Heart attack in her brother; Hypertension in her father and mother; Osteoarthritis in her mother; Thyroid disease in her mother. There is no history of Breast cancer.  ROS:   Please see the history of present illness.     All other systems reviewed and are negative.  EKGs/Labs/Other Studies Reviewed:    The following studies were reviewed today:    Recent Labs: 04/27/2022: ALT 20; BUN 14; Creatinine, Ser 0.71; Potassium 4.1; Sodium 141  Recent Lipid Panel    Component Value Date/Time   CHOL 153 04/27/2022 1204   TRIG 55 04/27/2022 1204   HDL 65 04/27/2022 1204   CHOLHDL 2.4 04/27/2022 1204   LDLCALC 77 04/27/2022 1204    Physical Exam:     Physical Exam: Blood pressure 124/80, pulse 64, height 5' 7.5" (1.715 m), weight 200 lb 6.4 oz (90.9 kg), last menstrual period 09/20/2011, SpO2 98%.       GEN:  mildly obese , middle age female  in no acute distress HEENT: Normal NECK: No JVD; No carotid bruits LYMPHATICS: No lymphadenopathy CARDIAC: RRR   RESPIRATORY:  Clear to auscultation without rales, wheezing or rhonchi  ABDOMEN: Soft, non-tender, non-distended MUSCULOSKELETAL:  No edema; No deformity  SKIN: Warm and dry NEUROLOGIC:  Alert and oriented x 3    EKG:      EKG Interpretation Date/Time:  Friday April 19 2023 14:01:47 EDT Ventricular Rate:  64 PR Interval:  126 QRS Duration:  98 QT Interval:  424 QTC Calculation: 437 R Axis:   69  Text Interpretation: Normal sinus rhythm Nonspecific ST abnormality No previous ECGs available Confirmed by Kristeen Miss ([id number]) on 04/19/2023 2:18:39 PM     ASSESSMENT:    1. Mixed hyperlipidemia       PLAN:       1.  Hyperlipidemia:    her goal LDL is less than 70.  Her last LDL last year was 7.  She is not really tolerating the rosuvastatin very well.  Will discontinue rosuvastatin and start her on atorvastatin 40  mg a day.  I have encouraged her to get to the gym on a regular basis.  Will check lipids, ALT, basic metabolic profile in 3 months.  If she is not at goal we will consider adding Zetia.  She  has an extremely strong family history of coronary artery disease. Her LP(a) is normal.   2.  Essential hypertension: .  Blood pressure is well-controlled.        Medication Adjustments/Labs and Tests Ordered: Current medicines are reviewed at length with the patient today.  Concerns regarding medicines are outlined above.  Orders Placed This Encounter  Procedures   EKG 12-Lead    No orders of the defined types were placed in this encounter.    There are no Patient Instructions on file for this visit.   Signed, Kristeen Miss, MD  04/19/2023 2:24 PM    [facility]

## 2023-04-19 ENCOUNTER — Encounter: Payer: Self-pay | Admitting: Cardiovascular Disease

## 2023-04-19 ENCOUNTER — Ambulatory Visit: Attending: Cardiology | Admitting: Cardiovascular Disease

## 2023-04-19 VITALS — BP 124/80 | HR 64 | Ht 67.5 in | Wt 200.4 lb

## 2023-04-19 DIAGNOSIS — E782 Mixed hyperlipidemia: Secondary | ICD-10-CM | POA: Diagnosis not present

## 2023-04-19 DIAGNOSIS — Z79899 Other long term (current) drug therapy: Secondary | ICD-10-CM | POA: Diagnosis not present

## 2023-04-19 MED ORDER — ATORVASTATIN CALCIUM 40 MG PO TABS: 40.0000 mg | ORAL_TABLET | Freq: Every day | ORAL | 3 refills | Status: AC

## 2023-04-19 NOTE — Patient Instructions (Signed)
 Medication Instructions:  STOP Rosuvastatin/Crestor START Atorvastatin/Lipitor 40mg  daily *If you need a refill on your cardiac medications before your next appointment, please call your pharmacy*   Lab Work: Lipids, ALT, BMET in 3 months If you have labs (blood work) drawn today and your tests are completely normal, you will receive your results only by: MyChart Message (if you have MyChart) OR A paper copy in the mail If you have any lab test that is abnormal or we need to change your treatment, we will call you to review the results.  Follow-Up: At [facility], you and your health needs are our priority.  As part of our continuing mission to provide you with exceptional heart care, we have created designated Provider Care Teams.  These Care Teams include your primary Cardiologist (physician) and Advanced Practice Providers (APPs -  Physician Assistants and Nurse Practitioners) who all work together to provide you with the care you need, when you need it.  Your next appointment:   1 year(s)  Provider:   Kristeen Miss, MD      1st Floor: - Lobby - Registration  - Pharmacy  - Lab - Cafe  2nd Floor: - PV Lab - Diagnostic Testing (echo, CT, nuclear med)  3rd Floor: - Vacant  4th Floor: - TCTS (cardiothoracic surgery) - AFib Clinic - Structural Heart Clinic - Vascular Surgery  - Vascular Ultrasound  5th Floor: - HeartCare Cardiology (general and EP) - Clinical Pharmacy for coumadin, hypertension, lipid, weight-loss medications, and med management appointments    Valet parking services will be available as well.

## 2023-11-12 ENCOUNTER — Other Ambulatory Visit: Payer: Self-pay | Admitting: Family Medicine

## 2023-11-12 DIAGNOSIS — Z1231 Encounter for screening mammogram for malignant neoplasm of breast: Secondary | ICD-10-CM

## 2023-12-27 ENCOUNTER — Ambulatory Visit
Admission: RE | Admit: 2023-12-27 | Discharge: 2023-12-27 | Disposition: A | Discharge status: Home / Self Care | Source: Ambulatory Visit | Attending: Family Medicine | Admitting: Family Medicine

## 2023-12-27 DIAGNOSIS — Z1231 Encounter for screening mammogram for malignant neoplasm of breast: Secondary | ICD-10-CM
